# Patient Record
Sex: Male | Born: 1935 | Race: White | Hispanic: No | Marital: Married | Smoking: Never smoker
Health system: Southern US, Community
[De-identification: ages and names within clinical notes are randomized; demographics above are authoritative.]

## PROBLEM LIST (undated history)

## (undated) DIAGNOSIS — F0390 Unspecified dementia without behavioral disturbance: Secondary | ICD-10-CM

## (undated) DIAGNOSIS — K219 Gastro-esophageal reflux disease without esophagitis: Secondary | ICD-10-CM

## (undated) DIAGNOSIS — C801 Malignant (primary) neoplasm, unspecified: Secondary | ICD-10-CM

## (undated) DIAGNOSIS — I4891 Unspecified atrial fibrillation: Secondary | ICD-10-CM

## (undated) HISTORY — PX: CHOLECYSTECTOMY: SHX55

## (undated) HISTORY — PX: VASCULAR SURGERY: SHX849

## (undated) HISTORY — PX: PROSTATECTOMY: SHX69

---

## 1998-02-10 ENCOUNTER — Encounter: Payer: Self-pay | Admitting: Thoracic Surgery

## 1998-02-11 ENCOUNTER — Inpatient Hospital Stay: Admission: RE | Admit: 1998-02-11 | Discharge: 1998-02-12 | Payer: Self-pay | Admitting: Thoracic Surgery

## 2011-04-08 DIAGNOSIS — Z23 Encounter for immunization: Secondary | ICD-10-CM | POA: Diagnosis not present

## 2011-04-08 DIAGNOSIS — E782 Mixed hyperlipidemia: Secondary | ICD-10-CM | POA: Diagnosis not present

## 2011-04-08 DIAGNOSIS — Z79899 Other long term (current) drug therapy: Secondary | ICD-10-CM | POA: Diagnosis not present

## 2011-04-08 DIAGNOSIS — I4891 Unspecified atrial fibrillation: Secondary | ICD-10-CM | POA: Diagnosis not present

## 2011-04-08 DIAGNOSIS — I10 Essential (primary) hypertension: Secondary | ICD-10-CM | POA: Diagnosis not present

## 2011-07-01 DIAGNOSIS — E782 Mixed hyperlipidemia: Secondary | ICD-10-CM | POA: Diagnosis not present

## 2011-07-01 DIAGNOSIS — I4891 Unspecified atrial fibrillation: Secondary | ICD-10-CM | POA: Diagnosis not present

## 2011-07-01 DIAGNOSIS — I10 Essential (primary) hypertension: Secondary | ICD-10-CM | POA: Diagnosis not present

## 2011-07-01 DIAGNOSIS — G309 Alzheimer's disease, unspecified: Secondary | ICD-10-CM | POA: Diagnosis not present

## 2011-07-01 DIAGNOSIS — Z79899 Other long term (current) drug therapy: Secondary | ICD-10-CM | POA: Diagnosis not present

## 2011-10-06 DIAGNOSIS — L980 Pyogenic granuloma: Secondary | ICD-10-CM | POA: Diagnosis not present

## 2011-10-14 DIAGNOSIS — C44621 Squamous cell carcinoma of skin of unspecified upper limb, including shoulder: Secondary | ICD-10-CM | POA: Diagnosis not present

## 2011-10-25 DIAGNOSIS — C44621 Squamous cell carcinoma of skin of unspecified upper limb, including shoulder: Secondary | ICD-10-CM | POA: Diagnosis not present

## 2012-01-25 DIAGNOSIS — C44621 Squamous cell carcinoma of skin of unspecified upper limb, including shoulder: Secondary | ICD-10-CM | POA: Diagnosis not present

## 2012-01-25 DIAGNOSIS — L981 Factitial dermatitis: Secondary | ICD-10-CM | POA: Diagnosis not present

## 2012-02-24 DIAGNOSIS — C44621 Squamous cell carcinoma of skin of unspecified upper limb, including shoulder: Secondary | ICD-10-CM | POA: Diagnosis not present

## 2012-02-24 DIAGNOSIS — L570 Actinic keratosis: Secondary | ICD-10-CM | POA: Diagnosis not present

## 2012-04-21 DIAGNOSIS — Z79899 Other long term (current) drug therapy: Secondary | ICD-10-CM | POA: Diagnosis not present

## 2012-04-21 DIAGNOSIS — Z23 Encounter for immunization: Secondary | ICD-10-CM | POA: Diagnosis not present

## 2012-04-21 DIAGNOSIS — E782 Mixed hyperlipidemia: Secondary | ICD-10-CM | POA: Diagnosis not present

## 2012-04-21 DIAGNOSIS — Z125 Encounter for screening for malignant neoplasm of prostate: Secondary | ICD-10-CM | POA: Diagnosis not present

## 2012-04-26 DIAGNOSIS — L570 Actinic keratosis: Secondary | ICD-10-CM | POA: Diagnosis not present

## 2012-08-24 DIAGNOSIS — L570 Actinic keratosis: Secondary | ICD-10-CM | POA: Diagnosis not present

## 2012-09-26 DIAGNOSIS — L301 Dyshidrosis [pompholyx]: Secondary | ICD-10-CM | POA: Diagnosis not present

## 2012-09-26 DIAGNOSIS — L570 Actinic keratosis: Secondary | ICD-10-CM | POA: Diagnosis not present

## 2012-11-09 DIAGNOSIS — Z79899 Other long term (current) drug therapy: Secondary | ICD-10-CM | POA: Diagnosis not present

## 2012-11-09 DIAGNOSIS — I10 Essential (primary) hypertension: Secondary | ICD-10-CM | POA: Diagnosis not present

## 2012-11-09 DIAGNOSIS — Z0000 Encounter for general adult medical examination without abnormal findings: Secondary | ICD-10-CM | POA: Diagnosis not present

## 2012-11-09 DIAGNOSIS — E782 Mixed hyperlipidemia: Secondary | ICD-10-CM | POA: Diagnosis not present

## 2012-11-09 DIAGNOSIS — F0390 Unspecified dementia without behavioral disturbance: Secondary | ICD-10-CM | POA: Diagnosis not present

## 2013-05-29 DIAGNOSIS — E559 Vitamin D deficiency, unspecified: Secondary | ICD-10-CM | POA: Diagnosis not present

## 2013-05-29 DIAGNOSIS — E782 Mixed hyperlipidemia: Secondary | ICD-10-CM | POA: Diagnosis not present

## 2013-05-29 DIAGNOSIS — Z79899 Other long term (current) drug therapy: Secondary | ICD-10-CM | POA: Diagnosis not present

## 2013-05-29 DIAGNOSIS — Z125 Encounter for screening for malignant neoplasm of prostate: Secondary | ICD-10-CM | POA: Diagnosis not present

## 2013-06-08 DIAGNOSIS — C61 Malignant neoplasm of prostate: Secondary | ICD-10-CM | POA: Diagnosis not present

## 2013-06-08 DIAGNOSIS — F0390 Unspecified dementia without behavioral disturbance: Secondary | ICD-10-CM | POA: Diagnosis not present

## 2013-06-08 DIAGNOSIS — I10 Essential (primary) hypertension: Secondary | ICD-10-CM | POA: Diagnosis not present

## 2013-06-08 DIAGNOSIS — E782 Mixed hyperlipidemia: Secondary | ICD-10-CM | POA: Diagnosis not present

## 2013-11-30 DIAGNOSIS — E782 Mixed hyperlipidemia: Secondary | ICD-10-CM | POA: Diagnosis not present

## 2013-12-12 DIAGNOSIS — F02818 Dementia in other diseases classified elsewhere, unspecified severity, with other behavioral disturbance: Secondary | ICD-10-CM | POA: Diagnosis not present

## 2013-12-12 DIAGNOSIS — F0281 Dementia in other diseases classified elsewhere with behavioral disturbance: Secondary | ICD-10-CM | POA: Diagnosis not present

## 2013-12-12 DIAGNOSIS — Z760 Encounter for issue of repeat prescription: Secondary | ICD-10-CM | POA: Diagnosis not present

## 2013-12-12 DIAGNOSIS — G309 Alzheimer's disease, unspecified: Secondary | ICD-10-CM | POA: Diagnosis not present

## 2013-12-12 DIAGNOSIS — F0280 Dementia in other diseases classified elsewhere without behavioral disturbance: Secondary | ICD-10-CM | POA: Diagnosis not present

## 2013-12-12 DIAGNOSIS — Z23 Encounter for immunization: Secondary | ICD-10-CM | POA: Diagnosis not present

## 2014-03-11 DIAGNOSIS — N3090 Cystitis, unspecified without hematuria: Secondary | ICD-10-CM | POA: Diagnosis not present

## 2014-03-11 DIAGNOSIS — N3000 Acute cystitis without hematuria: Secondary | ICD-10-CM | POA: Diagnosis not present

## 2014-04-14 DIAGNOSIS — R300 Dysuria: Secondary | ICD-10-CM | POA: Diagnosis not present

## 2014-06-03 DIAGNOSIS — Z79899 Other long term (current) drug therapy: Secondary | ICD-10-CM | POA: Diagnosis not present

## 2014-06-03 DIAGNOSIS — E782 Mixed hyperlipidemia: Secondary | ICD-10-CM | POA: Diagnosis not present

## 2014-06-12 DIAGNOSIS — Z23 Encounter for immunization: Secondary | ICD-10-CM | POA: Diagnosis not present

## 2014-06-12 DIAGNOSIS — G301 Alzheimer's disease with late onset: Secondary | ICD-10-CM | POA: Diagnosis not present

## 2014-06-12 DIAGNOSIS — F0281 Dementia in other diseases classified elsewhere with behavioral disturbance: Secondary | ICD-10-CM | POA: Diagnosis not present

## 2014-06-12 DIAGNOSIS — I4891 Unspecified atrial fibrillation: Secondary | ICD-10-CM | POA: Diagnosis not present

## 2014-06-12 DIAGNOSIS — Z0000 Encounter for general adult medical examination without abnormal findings: Secondary | ICD-10-CM | POA: Diagnosis not present

## 2014-06-12 DIAGNOSIS — C61 Malignant neoplasm of prostate: Secondary | ICD-10-CM | POA: Diagnosis not present

## 2014-07-23 DIAGNOSIS — L578 Other skin changes due to chronic exposure to nonionizing radiation: Secondary | ICD-10-CM | POA: Diagnosis not present

## 2014-07-23 DIAGNOSIS — L820 Inflamed seborrheic keratosis: Secondary | ICD-10-CM | POA: Diagnosis not present

## 2014-07-23 DIAGNOSIS — L821 Other seborrheic keratosis: Secondary | ICD-10-CM | POA: Diagnosis not present

## 2014-07-23 DIAGNOSIS — L570 Actinic keratosis: Secondary | ICD-10-CM | POA: Diagnosis not present

## 2014-08-16 DIAGNOSIS — R300 Dysuria: Secondary | ICD-10-CM | POA: Diagnosis not present

## 2014-08-16 DIAGNOSIS — R5383 Other fatigue: Secondary | ICD-10-CM | POA: Diagnosis not present

## 2014-08-16 DIAGNOSIS — N3090 Cystitis, unspecified without hematuria: Secondary | ICD-10-CM | POA: Diagnosis not present

## 2014-09-23 DIAGNOSIS — L300 Nummular dermatitis: Secondary | ICD-10-CM | POA: Diagnosis not present

## 2014-09-23 DIAGNOSIS — L570 Actinic keratosis: Secondary | ICD-10-CM | POA: Diagnosis not present

## 2015-01-02 DIAGNOSIS — T45511A Poisoning by anticoagulants, accidental (unintentional), initial encounter: Secondary | ICD-10-CM | POA: Diagnosis not present

## 2015-01-02 DIAGNOSIS — Z7901 Long term (current) use of anticoagulants: Secondary | ICD-10-CM | POA: Diagnosis not present

## 2015-01-02 DIAGNOSIS — Z23 Encounter for immunization: Secondary | ICD-10-CM | POA: Diagnosis not present

## 2015-01-02 DIAGNOSIS — R8299 Other abnormal findings in urine: Secondary | ICD-10-CM | POA: Diagnosis not present

## 2015-01-10 DIAGNOSIS — Z7901 Long term (current) use of anticoagulants: Secondary | ICD-10-CM | POA: Diagnosis not present

## 2015-02-17 DIAGNOSIS — Z7901 Long term (current) use of anticoagulants: Secondary | ICD-10-CM | POA: Diagnosis not present

## 2015-03-03 DIAGNOSIS — M25559 Pain in unspecified hip: Secondary | ICD-10-CM | POA: Diagnosis not present

## 2015-03-03 DIAGNOSIS — S299XXA Unspecified injury of thorax, initial encounter: Secondary | ICD-10-CM | POA: Diagnosis not present

## 2015-03-03 DIAGNOSIS — S51011A Laceration without foreign body of right elbow, initial encounter: Secondary | ICD-10-CM | POA: Diagnosis not present

## 2015-03-03 DIAGNOSIS — F0390 Unspecified dementia without behavioral disturbance: Secondary | ICD-10-CM | POA: Diagnosis not present

## 2015-03-03 DIAGNOSIS — M25521 Pain in right elbow: Secondary | ICD-10-CM | POA: Diagnosis not present

## 2015-03-03 DIAGNOSIS — Z23 Encounter for immunization: Secondary | ICD-10-CM | POA: Diagnosis not present

## 2015-03-03 DIAGNOSIS — Z7901 Long term (current) use of anticoagulants: Secondary | ICD-10-CM | POA: Diagnosis not present

## 2015-03-03 DIAGNOSIS — S59901A Unspecified injury of right elbow, initial encounter: Secondary | ICD-10-CM | POA: Diagnosis not present

## 2015-03-03 DIAGNOSIS — T148 Other injury of unspecified body region: Secondary | ICD-10-CM | POA: Diagnosis not present

## 2015-03-03 DIAGNOSIS — S51811A Laceration without foreign body of right forearm, initial encounter: Secondary | ICD-10-CM | POA: Diagnosis not present

## 2015-03-10 DIAGNOSIS — R404 Transient alteration of awareness: Secondary | ICD-10-CM | POA: Diagnosis not present

## 2015-03-10 DIAGNOSIS — R4182 Altered mental status, unspecified: Secondary | ICD-10-CM | POA: Diagnosis not present

## 2015-03-10 DIAGNOSIS — N3000 Acute cystitis without hematuria: Secondary | ICD-10-CM | POA: Diagnosis not present

## 2015-03-10 DIAGNOSIS — R531 Weakness: Secondary | ICD-10-CM | POA: Diagnosis not present

## 2015-03-10 DIAGNOSIS — N390 Urinary tract infection, site not specified: Secondary | ICD-10-CM | POA: Diagnosis not present

## 2015-03-10 DIAGNOSIS — R410 Disorientation, unspecified: Secondary | ICD-10-CM | POA: Diagnosis not present

## 2015-03-13 DIAGNOSIS — E7800 Pure hypercholesterolemia, unspecified: Secondary | ICD-10-CM | POA: Diagnosis not present

## 2015-03-13 DIAGNOSIS — I4891 Unspecified atrial fibrillation: Secondary | ICD-10-CM | POA: Diagnosis not present

## 2015-03-13 DIAGNOSIS — I10 Essential (primary) hypertension: Secondary | ICD-10-CM | POA: Diagnosis not present

## 2015-03-13 DIAGNOSIS — N3000 Acute cystitis without hematuria: Secondary | ICD-10-CM | POA: Diagnosis not present

## 2015-03-13 DIAGNOSIS — S50311D Abrasion of right elbow, subsequent encounter: Secondary | ICD-10-CM | POA: Diagnosis not present

## 2015-03-13 DIAGNOSIS — R296 Repeated falls: Secondary | ICD-10-CM | POA: Diagnosis not present

## 2015-03-13 DIAGNOSIS — F329 Major depressive disorder, single episode, unspecified: Secondary | ICD-10-CM | POA: Diagnosis not present

## 2015-03-13 DIAGNOSIS — Z9181 History of falling: Secondary | ICD-10-CM | POA: Diagnosis not present

## 2015-03-13 DIAGNOSIS — F0391 Unspecified dementia with behavioral disturbance: Secondary | ICD-10-CM | POA: Diagnosis not present

## 2015-03-14 DIAGNOSIS — N3000 Acute cystitis without hematuria: Secondary | ICD-10-CM | POA: Diagnosis not present

## 2015-03-14 DIAGNOSIS — F329 Major depressive disorder, single episode, unspecified: Secondary | ICD-10-CM | POA: Diagnosis not present

## 2015-03-14 DIAGNOSIS — I10 Essential (primary) hypertension: Secondary | ICD-10-CM | POA: Diagnosis not present

## 2015-03-14 DIAGNOSIS — S50311D Abrasion of right elbow, subsequent encounter: Secondary | ICD-10-CM | POA: Diagnosis not present

## 2015-03-14 DIAGNOSIS — F0391 Unspecified dementia with behavioral disturbance: Secondary | ICD-10-CM | POA: Diagnosis not present

## 2015-03-14 DIAGNOSIS — R296 Repeated falls: Secondary | ICD-10-CM | POA: Diagnosis not present

## 2015-03-20 DIAGNOSIS — G301 Alzheimer's disease with late onset: Secondary | ICD-10-CM | POA: Diagnosis not present

## 2015-03-20 DIAGNOSIS — F0281 Dementia in other diseases classified elsewhere with behavioral disturbance: Secondary | ICD-10-CM | POA: Diagnosis not present

## 2015-03-20 DIAGNOSIS — S060X0A Concussion without loss of consciousness, initial encounter: Secondary | ICD-10-CM | POA: Diagnosis not present

## 2015-03-20 DIAGNOSIS — Z7901 Long term (current) use of anticoagulants: Secondary | ICD-10-CM | POA: Diagnosis not present

## 2015-03-20 DIAGNOSIS — C61 Malignant neoplasm of prostate: Secondary | ICD-10-CM | POA: Diagnosis not present

## 2015-03-20 DIAGNOSIS — I4891 Unspecified atrial fibrillation: Secondary | ICD-10-CM | POA: Diagnosis not present

## 2015-03-20 DIAGNOSIS — D510 Vitamin B12 deficiency anemia due to intrinsic factor deficiency: Secondary | ICD-10-CM | POA: Diagnosis not present

## 2015-03-27 DIAGNOSIS — N3000 Acute cystitis without hematuria: Secondary | ICD-10-CM | POA: Diagnosis not present

## 2015-06-17 DIAGNOSIS — N390 Urinary tract infection, site not specified: Secondary | ICD-10-CM | POA: Diagnosis not present

## 2015-06-18 DIAGNOSIS — N390 Urinary tract infection, site not specified: Secondary | ICD-10-CM | POA: Diagnosis not present

## 2015-07-15 DIAGNOSIS — N390 Urinary tract infection, site not specified: Secondary | ICD-10-CM | POA: Diagnosis not present

## 2015-08-28 ENCOUNTER — Emergency Department (HOSPITAL_COMMUNITY): Payer: PPO

## 2015-08-28 ENCOUNTER — Encounter (HOSPITAL_COMMUNITY): Payer: Self-pay | Admitting: Emergency Medicine

## 2015-08-28 ENCOUNTER — Observation Stay (HOSPITAL_COMMUNITY)
Admission: EM | Admit: 2015-08-28 | Discharge: 2015-08-29 | Disposition: A | Discharge status: Home / Self Care | Payer: PPO | Attending: Internal Medicine | Admitting: Internal Medicine

## 2015-08-28 DIAGNOSIS — F0390 Unspecified dementia without behavioral disturbance: Secondary | ICD-10-CM | POA: Diagnosis not present

## 2015-08-28 DIAGNOSIS — R000 Tachycardia, unspecified: Secondary | ICD-10-CM | POA: Diagnosis not present

## 2015-08-28 DIAGNOSIS — Z66 Do not resuscitate: Secondary | ICD-10-CM | POA: Insufficient documentation

## 2015-08-28 DIAGNOSIS — D751 Secondary polycythemia: Secondary | ICD-10-CM | POA: Insufficient documentation

## 2015-08-28 DIAGNOSIS — E860 Dehydration: Secondary | ICD-10-CM | POA: Insufficient documentation

## 2015-08-28 DIAGNOSIS — I4891 Unspecified atrial fibrillation: Secondary | ICD-10-CM | POA: Diagnosis not present

## 2015-08-28 DIAGNOSIS — R296 Repeated falls: Secondary | ICD-10-CM | POA: Diagnosis not present

## 2015-08-28 DIAGNOSIS — I482 Chronic atrial fibrillation: Secondary | ICD-10-CM | POA: Diagnosis not present

## 2015-08-28 DIAGNOSIS — Z79899 Other long term (current) drug therapy: Secondary | ICD-10-CM | POA: Diagnosis not present

## 2015-08-28 DIAGNOSIS — R4701 Aphasia: Secondary | ICD-10-CM | POA: Insufficient documentation

## 2015-08-28 DIAGNOSIS — R531 Weakness: Secondary | ICD-10-CM | POA: Insufficient documentation

## 2015-08-28 DIAGNOSIS — R55 Syncope and collapse: Secondary | ICD-10-CM | POA: Diagnosis not present

## 2015-08-28 DIAGNOSIS — I6789 Other cerebrovascular disease: Secondary | ICD-10-CM | POA: Diagnosis not present

## 2015-08-28 HISTORY — DX: Gastro-esophageal reflux disease without esophagitis: K21.9

## 2015-08-28 HISTORY — DX: Unspecified atrial fibrillation: I48.91

## 2015-08-28 HISTORY — DX: Unspecified dementia, unspecified severity, without behavioral disturbance, psychotic disturbance, mood disturbance, and anxiety: F03.90

## 2015-08-28 HISTORY — DX: Malignant (primary) neoplasm, unspecified: C80.1

## 2015-08-28 LAB — CBC
HCT: 53.6 % — ABNORMAL HIGH (ref 39.0–52.0)
HEMATOCRIT: 50.4 % (ref 39.0–52.0)
HEMOGLOBIN: 18 g/dL — AB (ref 13.0–17.0)
HEMOGLOBIN: 16.7 g/dL (ref 13.0–17.0)
MCH: 30.4 pg (ref 26.0–34.0)
MCH: 30.1 pg (ref 26.0–34.0)
MCHC: 33.6 g/dL (ref 30.0–36.0)
MCHC: 33.1 g/dL (ref 30.0–36.0)
MCV: 90.4 fL (ref 78.0–100.0)
MCV: 90.8 fL (ref 78.0–100.0)
PLATELETS: 191 10*3/uL (ref 150–400)
Platelets: 199 10*3/uL (ref 150–400)
RBC: 5.93 MIL/uL — AB (ref 4.22–5.81)
RBC: 5.55 MIL/uL (ref 4.22–5.81)
RDW: 13 % (ref 11.5–15.5)
RDW: 13.1 % (ref 11.5–15.5)
WBC: 11.2 10*3/uL — AB (ref 4.0–10.5)
WBC: 9.8 10*3/uL (ref 4.0–10.5)

## 2015-08-28 LAB — URINE MICROSCOPIC-ADD ON

## 2015-08-28 LAB — URINALYSIS, ROUTINE W REFLEX MICROSCOPIC
BILIRUBIN URINE: NEGATIVE
GLUCOSE, UA: NEGATIVE mg/dL
KETONES UR: NEGATIVE mg/dL
Leukocytes, UA: NEGATIVE
Nitrite: POSITIVE — AB
PH: 7 (ref 5.0–8.0)
Protein, ur: 30 mg/dL — AB
SPECIFIC GRAVITY, URINE: 1.009 (ref 1.005–1.030)

## 2015-08-28 LAB — I-STAT CHEM 8, ED
BUN: 16 mg/dL (ref 6–20)
CALCIUM ION: 1.09 mmol/L — AB (ref 1.13–1.30)
CHLORIDE: 94 mmol/L — AB (ref 101–111)
CREATININE: 1.2 mg/dL (ref 0.61–1.24)
GLUCOSE: 179 mg/dL — AB (ref 65–99)
HCT: 57 % — ABNORMAL HIGH (ref 39.0–52.0)
Hemoglobin: 19.4 g/dL — ABNORMAL HIGH (ref 13.0–17.0)
Potassium: 3.8 mmol/L (ref 3.5–5.1)
SODIUM: 140 mmol/L (ref 135–145)
TCO2: 32 mmol/L (ref 0–100)

## 2015-08-28 LAB — COMPREHENSIVE METABOLIC PANEL
ALBUMIN: 4.1 g/dL (ref 3.5–5.0)
ALK PHOS: 144 U/L — AB (ref 38–126)
ALT: 18 U/L (ref 17–63)
ANION GAP: 9 (ref 5–15)
AST: 24 U/L (ref 15–41)
BILIRUBIN TOTAL: 2.6 mg/dL — AB (ref 0.3–1.2)
BUN: 13 mg/dL (ref 6–20)
CHLORIDE: 98 mmol/L — AB (ref 101–111)
CO2: 31 mmol/L (ref 22–32)
Calcium: 9.9 mg/dL (ref 8.9–10.3)
Creatinine, Ser: 1.33 mg/dL — ABNORMAL HIGH (ref 0.61–1.24)
GFR calc non Af Amer: 46 mL/min — ABNORMAL LOW (ref >60)
GFR, EST AFRICAN AMERICAN: 53 mL/min — AB (ref >60)
GLUCOSE: 180 mg/dL — AB (ref 65–99)
POTASSIUM: 3.8 mmol/L (ref 3.5–5.1)
SODIUM: 138 mmol/L (ref 135–145)
Total Protein: 7.9 g/dL (ref 6.5–8.1)

## 2015-08-28 LAB — DIFFERENTIAL
Basophils Absolute: 0 10*3/uL (ref 0.0–0.1)
Basophils Relative: 0 %
EOS PCT: 0 %
Eosinophils Absolute: 0 10*3/uL (ref 0.0–0.7)
LYMPHS ABS: 0.9 10*3/uL (ref 0.7–4.0)
LYMPHS PCT: 8 %
MONO ABS: 0.4 10*3/uL (ref 0.1–1.0)
Monocytes Relative: 3 %
NEUTROS ABS: 10 10*3/uL — AB (ref 1.7–7.7)
Neutrophils Relative %: 89 %

## 2015-08-28 LAB — CREATININE, SERUM
CREATININE: 1.16 mg/dL (ref 0.61–1.24)
GFR calc Af Amer: >60 mL/min (ref >60)
GFR, EST NON AFRICAN AMERICAN: 54 mL/min — AB (ref >60)

## 2015-08-28 LAB — I-STAT CG4 LACTIC ACID, ED
Lactic Acid, Venous: 2.32 mmol/L (ref 0.5–2.0)
Lactic Acid, Venous: 1.58 mmol/L (ref 0.5–2.0)

## 2015-08-28 LAB — PROTIME-INR
INR: 1.16 (ref 0.00–1.49)
PROTHROMBIN TIME: 15 s (ref 11.6–15.2)

## 2015-08-28 LAB — RAPID URINE DRUG SCREEN, HOSP PERFORMED
AMPHETAMINES: NOT DETECTED
Barbiturates: NOT DETECTED
Benzodiazepines: NOT DETECTED
COCAINE: NOT DETECTED
OPIATES: NOT DETECTED
TETRAHYDROCANNABINOL: NOT DETECTED

## 2015-08-28 LAB — I-STAT TROPONIN, ED: Troponin i, poc: 0 ng/mL (ref 0.00–0.08)

## 2015-08-28 LAB — CBG MONITORING, ED: GLUCOSE-CAPILLARY: 172 mg/dL — AB (ref 65–99)

## 2015-08-28 LAB — ETHANOL: Alcohol, Ethyl (B): <5 mg/dL (ref <5)

## 2015-08-28 LAB — APTT: aPTT: 30 seconds (ref 24–37)

## 2015-08-28 MED ORDER — DILTIAZEM HCL 30 MG PO TABS
30.0000 mg | ORAL_TABLET | Freq: Four times a day (QID) | ORAL | Status: DC
  Administered 2015-08-28 – 2015-08-29 (×4): 30 mg via ORAL
  Filled 2015-08-28 (×6): qty 1

## 2015-08-28 MED ORDER — MEMANTINE HCL 10 MG PO TABS
10.0000 mg | ORAL_TABLET | Freq: Two times a day (BID) | ORAL | Status: DC
Start: 2015-08-28 — End: 2015-08-29
  Administered 2015-08-28 – 2015-08-29 (×2): 10 mg via ORAL
  Filled 2015-08-28 (×2): qty 1

## 2015-08-28 MED ORDER — SODIUM CHLORIDE 0.9 % IV BOLUS (SEPSIS)
1000.0000 mL | Freq: Once | INTRAVENOUS | Status: AC
  Administered 2015-08-28: 1000 mL via INTRAVENOUS

## 2015-08-28 MED ORDER — SODIUM CHLORIDE 0.9% FLUSH
3.0000 mL | Freq: Two times a day (BID) | INTRAVENOUS | Status: DC
  Administered 2015-08-28: 3 mL via INTRAVENOUS

## 2015-08-28 MED ORDER — SODIUM CHLORIDE 0.9 % IV SOLN
INTRAVENOUS | Status: DC
  Administered 2015-08-28: 20:00:00 via INTRAVENOUS

## 2015-08-28 MED ORDER — HEPARIN SODIUM (PORCINE) 5000 UNIT/ML IJ SOLN
5000.0000 [IU] | Freq: Three times a day (TID) | INTRAMUSCULAR | Status: DC
  Administered 2015-08-28 – 2015-08-29 (×2): 5000 [IU] via SUBCUTANEOUS
  Filled 2015-08-28 (×2): qty 1

## 2015-08-28 MED ORDER — PAROXETINE HCL 20 MG PO TABS
10.0000 mg | ORAL_TABLET | Freq: Every day | ORAL | Status: DC
  Administered 2015-08-28: 10 mg via ORAL
  Filled 2015-08-28: qty 1

## 2015-08-28 MED ORDER — ASPIRIN 81 MG PO CHEW
81.0000 mg | CHEWABLE_TABLET | Freq: Every day | ORAL | Status: DC
  Administered 2015-08-29: 81 mg via ORAL
  Filled 2015-08-28: qty 1

## 2015-08-28 MED ORDER — DONEPEZIL HCL 10 MG PO TABS
10.0000 mg | ORAL_TABLET | Freq: Every day | ORAL | Status: DC
  Administered 2015-08-28 – 2015-08-29 (×2): 10 mg via ORAL
  Filled 2015-08-28 (×2): qty 1

## 2015-08-28 NOTE — ED Notes (Signed)
Arrived by EMS lives at home woke up normal history of dementia ate breakfast and got up to wash hands and had syncope event lasting less then 2 minutes reported by EMS. EMS first reported patient unable to move left upper extremity and non verbal. During transport patient able to move upper left extremity with weakness left hand grasp compared to right and able to say some words. CBG 193.

## 2015-08-28 NOTE — ED Notes (Signed)
This RN with Marya Amsler, RN, attempted 2x to obtain a urine specimen by cath without success.

## 2015-08-28 NOTE — Code Documentation (Signed)
82yo male arriving to [facility] via [place] at [phone].  EMS reports the patient was eating breakfast at 0930 when he got up to wash his hand and "fell out."  EMS assessed patient to be nonverbal and not following commands with left sided weakness and activated a code stroke.  Patient with h/o atrial fibrillation not on anticoagulation.  EMS reports the patient started speaking about 15 minutes prior to arrival.  Stroke team at the bedside on patient arrival.  Labs drawn and patient cleared for CT by Dr. Oleta Mouse.  Patient to CT with team.  CT completed.  Dr. Cheral Marker to the bedside.  Patient transported to A5.  NIHSS 10, see documentation for details and code stroke times.  Patient unable to answer questions or follow commands on exam.  Patient with drift in LUE and bilateral lower extremities on exam.  Patient will respond verbally to tactile stimulation.  No acute stroke treatment per Dr. Cheral Marker.  Code stroke canceled per Dr. Cheral Marker.  Bedside handoff with ED RN Marya Amsler.

## 2015-08-28 NOTE — ED Notes (Signed)
Pt's son and daughter are at bedside, state that pt at baseline cannot follow one step instructions and cannot state name.  Pt's daughter reports that pt was ambulating with walker when legs got shaky.  Pt's daughter reports getting pt into chair, then states, "He slumped over and I couldn't get him to wake up."  Pt's daughter reports pt had some loud breathing but denies any shaking, trembling, twitching.  Pt's daughter reports pt opened eyes when EMS shook him roughly calling his name.  Pt's daughter states that pt is often sleepy, even falling asleep while eating. Pt's daughter and son agree that pt is at baseline at this time. Pt recognized daughter and son, asked, "Where's Mama?", seemed to follow explanation that wife was unable to come to ED.

## 2015-08-28 NOTE — H&P (Signed)
TRH H&P   Patient Demographics:    Jesus Franklin, is a 82 y.o. male  MRN: [id number]   DOB - 1924-08-08  Admit Date - 08/28/2015  Outpatient Primary MD for the patient is No primary care provider on file.  Referring MD/NP/PA: Dr. Oleta Mouse  Patient coming from: Home  Chief Complaint  Patient presents with  . Code Stroke      HPI:    Jesus Franklin  is a 82 y.o. male, With past medical history of dementia, atrial fibrillation, who presents with syncope, and lives at home by himself, daughter lives across the street, after breakfast, daughter reports patient fell out, was unresponsive for 2 minutes, there was on EMS reports left-sided weakness, but when I questioned the daughter she denied that, she denied any dysarthria, facial droop, or significant focal deficits, reports episode lasted for 2 minutes, with no residual deficits, or seizure-like activity, porcine had generalized weakness with shaking legs, she reports has history of multiple UTIs in the past, she denies any fever or chills currently, she reports he is wearing depends, NED CT head with no acute CVA, labs significant for mildly elevated lactic acid, and hemoglobin of 18, I was called to admit    Review of systems:   Unable to obtain any review of system giving patient has advanced dementia.   With Past History of the following :    Past Medical History  Diagnosis Date  . Dementia       No past surgical history on file.    Social History:     Social History  Substance Use Topics  . Smoking status: Not on file  . Smokeless tobacco: Not on file  . Alcohol Use: Not on file     Lives - At home by himself  Mobility - with assistance     Family History :   Daughter denies any coronary artery disease at young age in the family.   Home Medications:   Prior to Admission medications   Medication Sig Start Date  End Date Taking? Authorizing Provider  donepezil (ARICEPT) 10 MG tablet Take 10 mg by mouth daily.   Yes Historical Provider, MD  memantine (NAMENDA) 10 MG tablet Take 10 mg by mouth 2 (two) times daily.   Yes Historical Provider, MD  PARoxetine (PAXIL) 10 MG tablet Take 10 mg by mouth at bedtime.   Yes Historical Provider, MD     Allergies:    Allergies not on file   Physical Exam:   Vitals  Blood pressure 132/85, pulse 93, temperature 97.6 F (36.4 C), temperature source Axillary, resp. rate 13, height 5\' 10"  (1.778 m), weight 75.4 kg (166 lb 3.6 oz), SpO2 92 %.   1. General Frail elderly male lying in bed in NAD,   2. Normal affect and insight, Not Suicidal or Homicidal, Awake, confused, does not follow commands or answer questions,  but verbal.  3. No F.N deficits, ALL C.Nerves Intact, appears to be moving all extremities with no focal deficits.  4. Ears and Eyes appear Normal, Conjunctivae clear, PERRLA. Moist Oral Mucosa.  5. Supple Neck, No JVD, No cervical lymphadenopathy appriciated, No Carotid Bruits.  6. Symmetrical Chest wall movement, Good air movement bilaterally, CTAB.  7. Irregular irregular, No Gallops, Rubs or Murmurs, No Parasternal Heave.  8. Positive Bowel Sounds, Abdomen Soft, No tenderness, No organomegaly appriciated,No rebound -guarding or rigidity.  9.  No Cyanosis, Normal Skin Turgor, No Skin Rash or Bruise.  10. Good muscle tone,  joints appear normal , no effusions, Normal ROM.  11. No Palpable Lymph Nodes in Neck or Axillae     Data Review:    CBC  Recent Labs Lab 08/28/15 1049 08/28/15 1053  WBC 11.2*  --   HGB 18.0* 19.4*  HCT 53.6* 57.0*  PLT 191  --   MCV 90.4  --   MCH 30.4  --   MCHC 33.6  --   RDW 13.0  --   LYMPHSABS 0.9  --   MONOABS 0.4  --   EOSABS 0.0  --   BASOSABS 0.0  --    ------------------------------------------------------------------------------------------------------------------  Chemistries    Recent Labs Lab 08/28/15 1049 08/28/15 1053  NA 138 140  K 3.8 3.8  CL 98* 94*  CO2 31  --   GLUCOSE 180* 179*  BUN 13 16  CREATININE 1.33* 1.20  CALCIUM 9.9  --   AST 24  --   ALT 18  --   ALKPHOS 144*  --   BILITOT 2.6*  --    ------------------------------------------------------------------------------------------------------------------ estimated creatinine clearance is 43.9 mL/min (by C-G formula based on Cr of 1.2). ------------------------------------------------------------------------------------------------------------------ No results for input(s): TSH, T4TOTAL, T3FREE, THYROIDAB in the last 72 hours.  Invalid input(s): FREET3  Coagulation profile  Recent Labs Lab 08/28/15 1049  INR 1.16   ------------------------------------------------------------------------------------------------------------------- No results for input(s): DDIMER in the last 72 hours. -------------------------------------------------------------------------------------------------------------------  Cardiac Enzymes No results for input(s): CKMB, TROPONINI, MYOGLOBIN in the last 168 hours.  Invalid input(s): CK ------------------------------------------------------------------------------------------------------------------ No results found for: BNP   ---------------------------------------------------------------------------------------------------------------  Urinalysis No results found for: COLORURINE, APPEARANCEUR, LABSPEC, PHURINE, GLUCOSEU, HGBUR, BILIRUBINUR, KETONESUR, PROTEINUR, UROBILINOGEN, NITRITE, LEUKOCYTESUR  ----------------------------------------------------------------------------------------------------------------   Imaging Results:    Ct Head Wo Contrast  08/28/2015  CLINICAL DATA:  Left-sided weakness, aphasia. EXAM: CT HEAD WITHOUT CONTRAST TECHNIQUE: Contiguous axial images were obtained from the base of the skull through the vertex without intravenous  contrast. COMPARISON:  CT scan of head of March 10, 2015. FINDINGS: Bony calvarium appears intact. Moderate diffuse cortical atrophy is noted. Mild chronic ischemic white matter disease is noted. No mass effect or midline shift is noted. Ventricular size is within normal limits. There is no evidence of mass lesion, hemorrhage or acute infarction. IMPRESSION: Moderate diffuse cortical atrophy. Mild chronic ischemic white matter disease. No acute intracranial abnormality seen. These results were called by telephone at the time of interpretation on 08/28/2015 at 11:09 am to Dr. Cheral Marker, who verbally acknowledged these results. Electronically Signed   By: Marijo Conception, M.D.   On: 08/28/2015 11:10   Dg Chest Portable 1 View  08/28/2015  CLINICAL DATA:  Dementia, fall, loss of consciousness. EXAM: PORTABLE CHEST 1 VIEW COMPARISON:  Chest x-rays dated 03/10/2015 and 03/03/2015. FINDINGS: Study is hypoinspiratory with crowding of the perihilar and bibasilar bronchovascular markings. Given the low lung volumes, lungs are clear. No pleural effusion  or pneumothorax seen. Cardiomediastinal silhouette appears stable. No acute osseous abnormality seen. Elevation of the right humeral head likely represents chronic overlying rotator cuff tear. IMPRESSION: Low lung volumes.  No evidence of acute cardiopulmonary abnormality. Electronically Signed   By: Franki Cabot M.D.   On: 08/28/2015 11:37    My personal review of EKG: Rhythm A Fib, Rate 105  /min, QTc 463  , no Acute ST changes   Assessment & Plan:    Active Problems:   Syncope   Dementia   Atrial fibrillation (HCC)   Multiple falls  Syncope - Presents with a brief episode of loss of consciousness, no focal deficits, likely related to CVA, MRI brain pending, neurology consult appreciated. - We'll check orthostatics is, we'll continue with gentle hydration meanwhile, will check urinalysis given history of multiple UTIs in the past, will hold on starting  antibiotics pending further results. - Monitor on telemetry, check 2-D echo  Dementia - Continue with home medication  Atrial fibrillation - Patient has been on Peridex and warfarin in the past by PCP, has been stopped secondary to multiple falls, daughter reports at least he had 6 falls over last year. - We'll start on aspirin 81 mg oral daily, heart rate with episodes of RVR, will start on low-dose Cardizem  Multiple falls - We'll consult PT  Polycythemia - Continue elevated hemoglobin at 18, this is most likely related to dehydration, continue with IV fluid, recheck CBC in a.m.  DVT Prophylaxis Heparin   AM Labs Ordered, also please review Full Orders  Family Communication: Admission, patients condition and plan of care including tests being ordered have been discussed with the daughter and son who indicate understanding and agree with the plan and Code Status.  Code Status DO NOT RESUSCITATE, confirmed by daughter and son at bedside  Likely DC to  Home  Condition GUARDED    Consults called: Neurology  Admission status: Observation  Time spent in minutes : 55 minutes   Marly Schuld M.D on 08/28/2015 at 1:15 PM  Between 7am to 7pm - Pager - [phone]. After 7pm go to [email or web address] - password [facility]  [facility] - Office  [phone]

## 2015-08-28 NOTE — ED Provider Notes (Signed)
CSN: [id number]     Arrival date & time 08/28/15  1041 History   First MD Initiated Contact with Patient 08/28/15 1043     Chief Complaint  Patient presents with  . Code Stroke     (Consider location/radiation/quality/duration/timing/severity/associated sxs/prior Treatment) HPI  Level 5 caveat due to AMS.  History provided by EMS. Patient has a history of dementia, but at baseline is conversational. Was getting up from the breakfast table today and walking when he "fell out". Had loss of consciousness for 2 minutes, and upon waking up was noted to be not moving his left side and not talkative. A code stroke activated by EMS. Initially noted to be hypertensive. Point-of-care glucose was 193.  Past Medical History  Diagnosis Date  . Dementia    No past surgical history on file. No family history on file. Social History  Substance Use Topics  . Smoking status: None  . Smokeless tobacco: None  . Alcohol Use: None    Review of Systems  Unable to perform ROS: Mental status change      Allergies  Review of patient's allergies indicates not on file.  Home Medications   Prior to Admission medications   Medication Sig Start Date End Date Taking? Authorizing Provider  donepezil (ARICEPT) 10 MG tablet Take 10 mg by mouth daily.   Yes Historical Provider, MD  memantine (NAMENDA) 10 MG tablet Take 10 mg by mouth 2 (two) times daily.   Yes Historical Provider, MD  PARoxetine (PAXIL) 10 MG tablet Take 10 mg by mouth at bedtime.   Yes Historical Provider, MD   BP 132/85 mmHg  Pulse 93  Temp(Src) 97.6 F (36.4 C) (Axillary)  Resp 13  Ht 5\' 10"  (1.778 m)  Wt 166 lb 3.6 oz (75.4 kg)  BMI 23.85 kg/m2  SpO2 92% Physical Exam Physical Exam  Nursing note and vitals reviewed. Constitutional: Elderly, cachectic, non-toxic, and in no acute distress Head: Normocephalic and atraumatic.  Mouth/Throat: Oropharynx is clear and dry mucous membrane.  Neck: Normal range of motion. Neck  supple.  Cardiovascular: Tachycardic rate and regular rhythm.  No edema Pulmonary/Chest: Effort normal and breath sounds normal.  Abdominal: Soft. There is no tenderness. There is no rebound and no guarding.  Musculoskeletal: No deformities Neurological: Opens eyes to repeated command, will say yes once or twice but does not answer any further questions, moves bilateral upper and lower extremities with tactile stimuli but will not obey commands, Pupils 2 mm symmetric and reactive to light Skin: Skin is warm and dry.  Psychiatric: Cooperative   ED Course  Procedures (including critical care time) Labs Review Labs Reviewed  CBC - Abnormal; Notable for the following:    WBC 11.2 (*)    RBC 5.93 (*)    Hemoglobin 18.0 (*)    HCT 53.6 (*)    All other components within normal limits  DIFFERENTIAL - Abnormal; Notable for the following:    Neutro Abs 10.0 (*)    All other components within normal limits  COMPREHENSIVE METABOLIC PANEL - Abnormal; Notable for the following:    Chloride 98 (*)    Glucose, Bld 180 (*)    Creatinine, Ser 1.33 (*)    Alkaline Phosphatase 144 (*)    Total Bilirubin 2.6 (*)    GFR calc non Af Amer 46 (*)    GFR calc Af Amer 53 (*)    All other components within normal limits  I-STAT CHEM 8, ED - Abnormal; Notable for the  following:    Chloride 94 (*)    Glucose, Bld 179 (*)    Calcium, Ion 1.09 (*)    Hemoglobin 19.4 (*)    HCT 57.0 (*)    All other components within normal limits  I-STAT CG4 LACTIC ACID, ED - Abnormal; Notable for the following:    Lactic Acid, Venous 2.32 (*)    All other components within normal limits  CBG MONITORING, ED - Abnormal; Notable for the following:    Glucose-Capillary 172 (*)    All other components within normal limits  ETHANOL  PROTIME-INR  APTT  URINE RAPID DRUG SCREEN, HOSP PERFORMED  URINALYSIS, ROUTINE W REFLEX MICROSCOPIC (NOT AT [facility])  I-STAT TROPOININ, ED    Imaging Review Ct Head Wo  Contrast  08/28/2015  CLINICAL DATA:  Left-sided weakness, aphasia. EXAM: CT HEAD WITHOUT CONTRAST TECHNIQUE: Contiguous axial images were obtained from the base of the skull through the vertex without intravenous contrast. COMPARISON:  CT scan of head of March 10, 2015. FINDINGS: Bony calvarium appears intact. Moderate diffuse cortical atrophy is noted. Mild chronic ischemic white matter disease is noted. No mass effect or midline shift is noted. Ventricular size is within normal limits. There is no evidence of mass lesion, hemorrhage or acute infarction. IMPRESSION: Moderate diffuse cortical atrophy. Mild chronic ischemic white matter disease. No acute intracranial abnormality seen. These results were called by telephone at the time of interpretation on 08/28/2015 at 11:09 am to Dr. Cheral Marker, who verbally acknowledged these results. Electronically Signed   By: Marijo Conception, M.D.   On: 08/28/2015 11:10   Dg Chest Portable 1 View  08/28/2015  CLINICAL DATA:  Dementia, fall, loss of consciousness. EXAM: PORTABLE CHEST 1 VIEW COMPARISON:  Chest x-rays dated 03/10/2015 and 03/03/2015. FINDINGS: Study is hypoinspiratory with crowding of the perihilar and bibasilar bronchovascular markings. Given the low lung volumes, lungs are clear. No pleural effusion or pneumothorax seen. Cardiomediastinal silhouette appears stable. No acute osseous abnormality seen. Elevation of the right humeral head likely represents chronic overlying rotator cuff tear. IMPRESSION: Low lung volumes.  No evidence of acute cardiopulmonary abnormality. Electronically Signed   By: Franki Cabot M.D.   On: 08/28/2015 11:37   I have personally reviewed and evaluated these images and lab results as part of my medical decision-making.   EKG Interpretation   Date/Time:  Thursday August 28 2015 10:59:59 EDT Ventricular Rate:  105 PR Interval:    QRS Duration: 108 QT Interval:  350 QTC Calculation: 463 R Axis:   -35 Text Interpretation:   Atrial fibrillation Abnormal R-wave progression,  early transition LVH with secondary repolarization abnormality Baseline  wander in lead(s) II III aVF V2 No prior atrial fibrillation Confirmed by  Quamir Willemsen MD, Hinton Dyer [id number]) on 08/28/2015 12:14:21 PM      MDM   Final diagnoses:  Atrial fibrillation with RVR (HCC)  Syncope and collapse    82 year old male who presents with syncope and strokelike symptoms. On presentation seems to be at his baseline according to family. Now moving extremities spontaneously, and only saying one-word answers to questions. He has a CT head that is negative for intracranial bleeding. He does have new onset atrial fibrillation with rate in the 90s to 110s. Neurology did evaluate and did not feel symptoms consistent with that of stroke, but recommending MRI and if negative no further workup. Altered mental status and syncope workup also performed. No major metabolic or electrolyte derangements, but does have potential mild AK I with a  creatinine of 1.33 and mildly elevated lactate of 2.5. Looks dry on exam and likely from dehydration. Blood work also looks hemoconcentrated. Given 1 L of IV fluids. Pending urinalysis to evaluate for UTI. Chest x-ray is clear. Discussed with [facility] will admit for new onset atrial fibrillation and syncopal workup.    Forde Dandy, MD 08/28/15 1308

## 2015-08-28 NOTE — Care Management Note (Signed)
Case Management Note  Patient Details  Name: Jesus Franklin MRN: [id number] Date of Birth: 06/12/1924  Subjective/Objective:                  Patient has a history of dementia, but at baseline is conversational. Was getting up from the breakfast table today and walking when he "fell out". Had loss of consciousness for 2 minutes, and upon waking up was noted to be not moving his left side and not talkative./ From home with spouse.  Action/Plan: Follow for disposition needs. /Anticipate home health vs SNF   Expected Discharge Date:  08/30/15               Expected Discharge Plan:  [place]  In-House Referral:     Discharge planning Services  CM Consult  Post Acute Care Choice:    Choice offered to:     DME Arranged:    DME Agency:     HH Arranged:    HH Agency:     Status of Service:  In process, will continue to follow  Medicare Important Message Given:    Date Medicare IM Given:    Medicare IM give by:    Date Additional Medicare IM Given:    Additional Medicare Important Message give by:     If discussed at [place] of Stay Meetings, dates discussed:    Additional Comments:  Fuller Mandril, RN 08/28/2015, 2:25 PM

## 2015-08-28 NOTE — Progress Notes (Signed)
Patient arrived in the unit accompanied by two NT via stretcher. Patient alert but confused and does not follow commands.Jesus KitchentFamily members at bedside. Family members verbalizes understanding.

## 2015-08-28 NOTE — ED Notes (Signed)
Pt CBG, 172. Nurse was notified.

## 2015-08-28 NOTE — Consult Note (Signed)
Requesting Physician: Dr. Oleta Mouse    Chief Complaint: Code stroke  HPI:                                                                                                                                         Jesus Franklin is an 82 y.o. male arriving to [facility] via [facility] at [phone]. EMS reports the patient was eating breakfast at 0930 when he got up to wash his hand and "fell out." EMS assessed patient to be nonverbal and not following commands with left sided weakness and activated a code stroke. Patient with h/o atrial fibrillation not on anticoagulation. EMS reports the patient started speaking about 15 minutes prior to arrival. Stroke team at the bedside on patient arrival. Labs drawn and patient cleared for CT by Dr. Oleta Mouse. CT completed and showed no acute stroke. Initial NIHSS 10, with significant improvement at time of exam by Neurology consultation team. Patient unable to answer questions or follow commands on exam. Patient with drift in LUE and bilateral lower extremities on exam. Patient will respond verbally to tactile stimulation.  Date last known well: Today Time last known well: Time: 09:00 tPA Given: No: minimal symptoms   Past Medical History  Diagnosis Date  . Dementia     No past surgical history on file.  No family history on file. Social History:  has no tobacco, alcohol, and drug history on file.  Allergies: Allergies not on file  Medications:                                                                                                                           Current Facility-Administered Medications  Medication Dose Route Frequency Provider Last Rate Last Dose  . sodium chloride 0.9 % bolus 1,000 mL  1,000 mL Intravenous Once Forde Dandy, MD      . sodium chloride 0.9 % bolus 1,000 mL  1,000 mL Intravenous Once Forde Dandy, MD       Current Outpatient Prescriptions  Medication Sig Dispense Refill  . donepezil (ARICEPT) 10 MG tablet Take 10 mg by  mouth daily.    . memantine (NAMENDA) 10 MG tablet Take 10 mg by mouth 2 (two) times daily.    Marland Kitchen PARoxetine (PAXIL) 10 MG tablet Take 10 mg by mouth at bedtime.       ROS:  History obtained from unobtainable from patient due to mental status   Neurologic Examination:                                                                                                      Blood pressure 176/114, pulse 114, temperature 97.6 F (36.4 C), temperature source Axillary, resp. rate 20, height 5\' 10"  (1.778 m), weight 75.4 kg (166 lb 3.6 oz), SpO2 94 %.  HEENT-  Normocephalic, no lesions, without obvious abnormality.  Normal external eye and conjunctiva.  Normal TM's bilaterally.  Normal auditory canals and external ears. Normal external nose, mucus membranes and septum.  Normal pharynx. Cardiovascular- irregularly irregular rhythm, pulses palpable throughout   Lungs- chest clear, no wheezing, rales, normal symmetric air entry Abdomen- normal findings: bowel sounds normal Extremities- no edema Lymph-no adenopathy palpable Musculoskeletal-no joint tenderness, deformity or swelling Skin-warm and dry, no hyperpigmentation, vitiligo, or suspicious lesions  Neurological Examination Mental Status: Alert, follows no commands, withdraws to noxious stimuli. Will verbalize "hey, ouch". Cranial Nerves: II: blinks bilaterally to threat, pupils equal, round, reactive to light and accommodation III,IV, VI: ptosis not present, extra-ocular motions intact bilaterally V,VII: smile symmetric, facial light touch sensation normal bilaterally VIII: hearing decreased bilaterally IX,X: uvula rises symmetrically XI: bilateral shoulder shrug XII: midline tongue extension Motor: Right : Upper extremity   5/5    Left:     Upper extremity   5/5  Lower extremity   5/5     Lower extremity    5/5 Sensory: Pinprick and light touch intact throughout, bilaterally Deep Tendon Reflexes: 2+ and symmetric throughout Plantars: Right: downgoing   Left: downgoing Cerebellar: No dysmetria noted in UE Gait: not tested  Lab Results: Basic Metabolic Panel:  Recent Labs Lab 08/28/15 1049 08/28/15 1053  NA 138 140  K 3.8 3.8  CL 98* 94*  CO2 31  --   GLUCOSE 180* 179*  BUN 13 16  CREATININE 1.33* 1.20  CALCIUM 9.9  --     Liver Function Tests:  Recent Labs Lab 08/28/15 1049  AST 24  ALT 18  ALKPHOS 144*  BILITOT 2.6*  PROT 7.9  ALBUMIN 4.1   No results for input(s): LIPASE, AMYLASE in the last 168 hours. No results for input(s): AMMONIA in the last 168 hours.  CBC:  Recent Labs Lab 08/28/15 1049 08/28/15 1053  WBC 11.2*  --   NEUTROABS 10.0*  --   HGB 18.0* 19.4*  HCT 53.6* 57.0*  MCV 90.4  --   PLT 191  --     Cardiac Enzymes: No results for input(s): CKTOTAL, CKMB, CKMBINDEX, TROPONINI in the last 168 hours.  Lipid Panel: No results for input(s): CHOL, TRIG, HDL, CHOLHDL, VLDL, LDLCALC in the last 168 hours.  CBG:  Recent Labs Lab 08/28/15 [place]    Microbiology: No results found for this or any previous visit.  Coagulation Studies:  Recent Labs  08/28/15 1049  LABPROT 15.0  INR 1.16    Imaging: Ct Head Wo Contrast  08/28/2015  CLINICAL DATA:  Left-sided weakness, aphasia. EXAM:  CT HEAD WITHOUT CONTRAST TECHNIQUE: Contiguous axial images were obtained from the base of the skull through the vertex without intravenous contrast. COMPARISON:  CT scan of head of March 10, 2015. FINDINGS: Bony calvarium appears intact. Moderate diffuse cortical atrophy is noted. Mild chronic ischemic white matter disease is noted. No mass effect or midline shift is noted. Ventricular size is within normal limits. There is no evidence of mass lesion, hemorrhage or acute infarction. IMPRESSION: Moderate diffuse cortical atrophy. Mild chronic  ischemic white matter disease. No acute intracranial abnormality seen. These results were called by telephone at the time of interpretation on 08/28/2015 at 11:09 am to Dr. Cheral Marker, who verbally acknowledged these results. Electronically Signed   By: Marijo Conception, M.D.   On: 08/28/2015 11:10    History and examination documented by Etta Quill PA-C, [facility], [phone]  Assessment:  62. 82 y.o. male with transient LOC followed by decreased responsiveness and mutism, more characteristic of syncope and delayed recovery in the context of underlying dementia, rather than acute stroke. Patient has significant dementia and can not give any history. Exam has no localizing or lateralizing findings other than dysphasia, exhibiting a pattern more consistent with dementia than a receptive or expressive aphasia secondary to stroke. Seizure is possible, but relatively unlikely.  2. No acute abnormality seen on CT head.   3. Stroke Risk Factors - atrial fibrillation. 4. Elevated hematocrit. DDx includes polycythemia vera.   Recommend: 1. MRI brain. If negative, no further stroke work up.  2. EEG.  3. Likely a falls risk and therefore not a good candidate for oral anticoagulation. Consider starting ASA for primary stroke prevention in the setting of atrial fibrillation.   Electronically signed: Dr. Kerney Elbe 08/28/2015, 11:30 AM

## 2015-08-28 NOTE — ED Notes (Signed)
Attempted to call report

## 2015-08-29 ENCOUNTER — Observation Stay (HOSPITAL_COMMUNITY): Payer: PPO

## 2015-08-29 DIAGNOSIS — I482 Chronic atrial fibrillation: Secondary | ICD-10-CM | POA: Diagnosis not present

## 2015-08-29 DIAGNOSIS — F0390 Unspecified dementia without behavioral disturbance: Secondary | ICD-10-CM

## 2015-08-29 DIAGNOSIS — R296 Repeated falls: Secondary | ICD-10-CM | POA: Diagnosis not present

## 2015-08-29 DIAGNOSIS — R55 Syncope and collapse: Secondary | ICD-10-CM | POA: Diagnosis not present

## 2015-08-29 LAB — BASIC METABOLIC PANEL
Anion gap: 10 (ref 5–15)
BUN: 11 mg/dL (ref 6–20)
CALCIUM: 9.2 mg/dL (ref 8.9–10.3)
CHLORIDE: 102 mmol/L (ref 101–111)
CO2: 26 mmol/L (ref 22–32)
CREATININE: 1.1 mg/dL (ref 0.61–1.24)
GFR calc Af Amer: >60 mL/min (ref >60)
GFR calc non Af Amer: 58 mL/min — ABNORMAL LOW (ref >60)
GLUCOSE: 99 mg/dL (ref 65–99)
Potassium: 3.4 mmol/L — ABNORMAL LOW (ref 3.5–5.1)
Sodium: 138 mmol/L (ref 135–145)

## 2015-08-29 LAB — CBC
HCT: 46 % (ref 39.0–52.0)
Hemoglobin: 14.9 g/dL (ref 13.0–17.0)
MCH: 29.6 pg (ref 26.0–34.0)
MCHC: 32.4 g/dL (ref 30.0–36.0)
MCV: 91.5 fL (ref 78.0–100.0)
PLATELETS: 180 10*3/uL (ref 150–400)
RBC: 5.03 MIL/uL (ref 4.22–5.81)
RDW: 13.2 % (ref 11.5–15.5)
WBC: 9.6 10*3/uL (ref 4.0–10.5)

## 2015-08-29 MED ORDER — ASPIRIN 81 MG PO CHEW: 81.0000 mg | CHEWABLE_TABLET | Freq: Every day | ORAL | Status: AC

## 2015-08-29 MED ORDER — DILTIAZEM HCL 30 MG PO TABS: 30.0000 mg | ORAL_TABLET | Freq: Four times a day (QID) | ORAL | Status: AC

## 2015-08-29 MED ORDER — OFF THE BEAT BOOK
Freq: Once | Status: AC
  Administered 2015-08-29: 10:00:00
  Filled 2015-08-29: qty 1

## 2015-08-29 MED ORDER — POTASSIUM CHLORIDE CRYS ER 20 MEQ PO TBCR
40.0000 meq | EXTENDED_RELEASE_TABLET | Freq: Once | ORAL | Status: AC
  Administered 2015-08-29: 40 meq via ORAL
  Filled 2015-08-29: qty 2

## 2015-08-29 NOTE — Discharge Summary (Signed)
Physician Discharge Summary  Jesus Franklin [id number] DOB: Mar 31, 1924 DOA: 08/28/2015  PCP: Ann Held, MD  Admit date: 08/28/2015 Discharge date: 08/29/2015  Recommendations for Outpatient Follow-up:  1.  PCP in 1 week for reeval of a-fib and tolerance of diltiazem and aspirin.  Repeat BMP 2.  Family already arranging for equipment and home health services through their PCP  Discharge Diagnoses:  Active Problems:   Syncope   Dementia   Atrial fibrillation (HCC)   Multiple falls   Discharge Condition: stable, improved  Diet recommendation:  regular  Wt Readings from Last 3 Encounters:  08/29/15 75.252 kg (165 lb 14.4 oz)    History of present illness:   Mathias Cicero is a 82 y.o. Male with history of dementia, atrial fibrillation, recurrent UTI who presents with syncope.  He lives at home by himself, daughter lives across the street.  After breakfast, daughter reported patient became unresponsive for 2 minutes.  EMS reported left-sided weakness, but daughter denied any dysarthria, facial droop, or significant focal deficits.  Episode lasted for 2 minutes, with no residual deficits, or seizure-like activity.  He had some generalized weakness with shaking legs.  She denied any fever or chills.  He has not been eating or drinking as much recently.  CT head with no acute CVA, labs significant for mildly elevated lactic acid, and hemoglobin of 18.    Hospital Course:   Syncope, likely orthostatic due to dehydration secondary to dementia.  His UA was positive for nitrites, however, he did not have many bacteria or WBC to suggest active infection.  Urine culture is pending but he was not started on antibiotics.  His hemoconcentrated CBC and elevated BUN:Cr suggested dehydration.  He was given IVF and he had dramatic improvement in his mentation.  His daughter and granddaughter stayed with him and felt that he is back to his baseline mentation this morning.  They asked whether the ECHO  and MRI that were ordered by the admitting physician would change management and stated that he would not want to have full anticoagulation or anything more than a baby aspirin, nor would he want any procedures.  He has a living will that is very clear about what his wishes were for his end-of-life care.  They requested that they be able to take him home and continue to care for him.  His daughter and granddaughter and wife all share caretaking responsibilities for him.  His telemetry demonstrated a-fib with rate that was trending down with IVF.  He is at risk for recurrent dehydration due to his dementia.  If this becomes a frequent problem, PCP can refer for hospice.  Family trusts PCP and PCP is already arranging for home hospital bed.  They will request home health RN and PT if they feel he needs it when they get home.    Dementia Continued with home medication  Atrial fibrillation, chronic.  CHADs2vasc = at least 2 for age.  Patient has been on full anticoagulation in the past, but this was stopped secondary to multiple falls in the last year.  The admitting physician discussed aspirin and medication for rate control due to tachycardia to the 120s and the family was amenable to this plan.  I discussed the added pill burden, particularly since the diltiazem is four times a day and we are potentially only treating numbers and not symptoms.  He was unable to transition to extended release dilt because he is unable to swallow capsules.  Family elected to  try a Kabella Cassidy period of the medication and then discuss again with his PCP.   Multiple falls.  Family aware of falls risk.  Feel he is back to his baseline and want to return home.  Will consult with PCP regarding home health PT if they feel it is needed.    Elevated hemoglobin at 18, due to dehydration, and resolved with IVF.  Procedures:  none  Consultations:  none  Discharge Exam: Filed Vitals:   08/29/15 0613 08/29/15 0805  BP: 158/89 149/98   Pulse: 77   Temp: 98.7 F (37.1 C)   Resp: 18    Filed Vitals:   08/28/15 2027 08/29/15 0050 08/29/15 0613 08/29/15 0805  BP: 156/98 130/57 158/89 149/98  Pulse: 86 82 77   Temp: 98.6 F (37 C) 98 F (36.7 C) 98.7 F (37.1 C)   TempSrc: Oral Oral Axillary   Resp: 18 18 18    Height:      Weight:   75.252 kg (165 lb 14.4 oz)   SpO2: 96% 96% 97%     General: thin adult male, sleeping but arouseable, confused and minimally communicative Cardiovascular: RRR, no mrg Respiratory: CTAB anteriorly ABD:  NABS, soft, NT/ND MSK:  No LEE, normal tone and decreased bulk Neuro:  CN II-XII grossly intact, moves all extremities spontaneously  Discharge Instructions      Discharge Instructions    Call MD for:  difficulty breathing, headache or visual disturbances    Complete by:  As directed      Call MD for:  extreme fatigue    Complete by:  As directed      Call MD for:  hives    Complete by:  As directed      Call MD for:  persistant dizziness or light-headedness    Complete by:  As directed      Call MD for:  persistant nausea and vomiting    Complete by:  As directed      Call MD for:  severe uncontrolled pain    Complete by:  As directed      Call MD for:  temperature >100.4    Complete by:  As directed      Diet general    Complete by:  As directed      Increase activity slowly    Complete by:  As directed             Medication List    TAKE these medications        aspirin 81 MG chewable tablet  Chew 1 tablet (81 mg total) by mouth daily.     diltiazem 30 MG tablet  Commonly known as:  CARDIZEM  Take 1 tablet (30 mg total) by mouth every 6 (six) hours.     donepezil 10 MG tablet  Commonly known as:  ARICEPT  Take 10 mg by mouth daily.     memantine 10 MG tablet  Commonly known as:  NAMENDA  Take 10 mg by mouth 2 (two) times daily.     PARoxetine 10 MG tablet  Commonly known as:  PAXIL  Take 10 mg by mouth at bedtime.       Follow-up Information     Follow up with Ann Held, MD On 09/05/2015.   Specialty:  Family Medicine   Why:  At 11:00am ... for [facility] information:   [place] [id number] [phone]        The results  of significant diagnostics from this hospitalization (including imaging, microbiology, ancillary and laboratory) are listed below for reference.    Significant Diagnostic Studies: Ct Head Wo Contrast  08/28/2015  CLINICAL DATA:  Left-sided weakness, aphasia. EXAM: CT HEAD WITHOUT CONTRAST TECHNIQUE: Contiguous axial images were obtained from the base of the skull through the vertex without intravenous contrast. COMPARISON:  CT scan of head of March 10, 2015. FINDINGS: Bony calvarium appears intact. Moderate diffuse cortical atrophy is noted. Mild chronic ischemic white matter disease is noted. No mass effect or midline shift is noted. Ventricular size is within normal limits. There is no evidence of mass lesion, hemorrhage or acute infarction. IMPRESSION: Moderate diffuse cortical atrophy. Mild chronic ischemic white matter disease. No acute intracranial abnormality seen. These results were called by telephone at the time of interpretation on 08/28/2015 at 11:09 am to Dr. Cheral Marker, who verbally acknowledged these results. Electronically Signed   By: Marijo Conception, M.D.   On: 08/28/2015 11:10   Dg Chest Portable 1 View  08/28/2015  CLINICAL DATA:  Dementia, fall, loss of consciousness. EXAM: PORTABLE CHEST 1 VIEW COMPARISON:  Chest x-rays dated 03/10/2015 and 03/03/2015. FINDINGS: Study is hypoinspiratory with crowding of the perihilar and bibasilar bronchovascular markings. Given the low lung volumes, lungs are clear. No pleural effusion or pneumothorax seen. Cardiomediastinal silhouette appears stable. No acute osseous abnormality seen. Elevation of the right humeral head likely represents chronic overlying rotator cuff tear. IMPRESSION: Low lung volumes.  No evidence of acute  cardiopulmonary abnormality. Electronically Signed   By: Franki Cabot M.D.   On: 08/28/2015 11:37    Microbiology: No results found for this or any previous visit (from the past 240 hour(s)).   Labs: Basic Metabolic Panel:  Recent Labs Lab 08/28/15 1049 08/28/15 1053 08/28/15 1952 08/29/15 0417  NA 138 140  --  138  K 3.8 3.8  --  3.4*  CL 98* 94*  --  102  CO2 31  --   --  26  GLUCOSE 180* 179*  --  99  BUN 13 16  --  11  CREATININE 1.33* 1.20 1.16 1.10  CALCIUM 9.9  --   --  9.2   Liver Function Tests:  Recent Labs Lab 08/28/15 1049  AST 24  ALT 18  ALKPHOS 144*  BILITOT 2.6*  PROT 7.9  ALBUMIN 4.1   No results for input(s): LIPASE, AMYLASE in the last 168 hours. No results for input(s): AMMONIA in the last 168 hours. CBC:  Recent Labs Lab 08/28/15 1049 08/28/15 1053 08/28/15 1952 08/29/15 0417  WBC 11.2*  --  9.8 9.6  NEUTROABS 10.0*  --   --   --   HGB 18.0* 19.4* 16.7 14.9  HCT 53.6* 57.0* 50.4 46.0  MCV 90.4  --  90.8 91.5  PLT 191  --  199 180   Cardiac Enzymes: No results for input(s): CKTOTAL, CKMB, CKMBINDEX, TROPONINI in the last 168 hours. BNP: BNP (last 3 results) No results for input(s): BNP in the last 8760 hours.  ProBNP (last 3 results) No results for input(s): PROBNP in the last 8760 hours.  CBG:  Recent Labs Lab 08/28/15 1109  GLUCAP 172*    Time coordinating discharge: 35 minutes  Signed:  Keandra Medero  [facility] 08/29/2015, 12:12 PM

## 2015-08-29 NOTE — Progress Notes (Signed)
PT Cancellation Note  Patient Details Name: Jesus Franklin MRN: [id number] DOB: 06-Jan-1925   Cancelled Treatment:    Reason Eval/Treat Not Completed: Other (comment). Per MD pt's family is going to take pt home and don't feel they need any services at this time. If they feel they need services they plan to go through their primary care physician. Will sign off.   Rory Montel 08/29/2015, 11:11 AM Suanne Marker PT [phone]

## 2015-08-29 NOTE — Care Management Obs Status (Signed)
[place] NOTIFICATION   Patient Details  Name: Jesus Franklin MRN: [id number] Date of Birth: 1924-05-02   Medicare Observation Status Notification Given:  Yes    MayoKym Groom, RN 08/29/2015, 10:15 AM

## 2015-08-29 NOTE — Progress Notes (Signed)
1352 Discharge instructions and presciprtions given to pt"s daughter . Verbalized undrestanding . Pt unable to understand R/T hx og dementia

## 2015-08-31 ENCOUNTER — Telehealth: Payer: Self-pay | Admitting: Internal Medicine

## 2015-08-31 NOTE — Telephone Encounter (Signed)
Patient acting agitated per family.  Will call in a prescription for antibiotics once sensitivities have been reported.

## 2015-09-01 LAB — URINE CULTURE: Culture: >=100000 — AB

## 2015-09-09 DIAGNOSIS — R269 Unspecified abnormalities of gait and mobility: Secondary | ICD-10-CM | POA: Diagnosis not present

## 2015-09-09 DIAGNOSIS — M1990 Unspecified osteoarthritis, unspecified site: Secondary | ICD-10-CM | POA: Diagnosis not present

## 2015-10-09 DIAGNOSIS — M1990 Unspecified osteoarthritis, unspecified site: Secondary | ICD-10-CM | POA: Diagnosis not present

## 2015-10-09 DIAGNOSIS — R269 Unspecified abnormalities of gait and mobility: Secondary | ICD-10-CM | POA: Diagnosis not present

## 2015-11-09 DIAGNOSIS — M1990 Unspecified osteoarthritis, unspecified site: Secondary | ICD-10-CM | POA: Diagnosis not present

## 2015-11-09 DIAGNOSIS — R269 Unspecified abnormalities of gait and mobility: Secondary | ICD-10-CM | POA: Diagnosis not present

## 2015-12-10 DIAGNOSIS — R269 Unspecified abnormalities of gait and mobility: Secondary | ICD-10-CM | POA: Diagnosis not present

## 2015-12-10 DIAGNOSIS — M1990 Unspecified osteoarthritis, unspecified site: Secondary | ICD-10-CM | POA: Diagnosis not present

## 2016-01-09 DIAGNOSIS — M1990 Unspecified osteoarthritis, unspecified site: Secondary | ICD-10-CM | POA: Diagnosis not present

## 2016-01-09 DIAGNOSIS — R269 Unspecified abnormalities of gait and mobility: Secondary | ICD-10-CM | POA: Diagnosis not present

## 2016-02-09 DIAGNOSIS — M1990 Unspecified osteoarthritis, unspecified site: Secondary | ICD-10-CM | POA: Diagnosis not present

## 2016-02-09 DIAGNOSIS — R269 Unspecified abnormalities of gait and mobility: Secondary | ICD-10-CM | POA: Diagnosis not present

## 2016-02-13 DIAGNOSIS — Z23 Encounter for immunization: Secondary | ICD-10-CM | POA: Diagnosis not present

## 2016-02-13 DIAGNOSIS — I10 Essential (primary) hypertension: Secondary | ICD-10-CM | POA: Diagnosis not present

## 2016-02-13 DIAGNOSIS — Z760 Encounter for issue of repeat prescription: Secondary | ICD-10-CM | POA: Diagnosis not present

## 2016-02-13 DIAGNOSIS — Z0000 Encounter for general adult medical examination without abnormal findings: Secondary | ICD-10-CM | POA: Diagnosis not present

## 2016-02-13 DIAGNOSIS — G301 Alzheimer's disease with late onset: Secondary | ICD-10-CM | POA: Diagnosis not present

## 2016-02-13 DIAGNOSIS — Z9181 History of falling: Secondary | ICD-10-CM | POA: Diagnosis not present

## 2016-02-13 DIAGNOSIS — R300 Dysuria: Secondary | ICD-10-CM | POA: Diagnosis not present

## 2016-02-13 DIAGNOSIS — F0281 Dementia in other diseases classified elsewhere with behavioral disturbance: Secondary | ICD-10-CM | POA: Diagnosis not present

## 2016-02-17 DIAGNOSIS — R300 Dysuria: Secondary | ICD-10-CM | POA: Diagnosis not present

## 2016-03-10 DIAGNOSIS — M1990 Unspecified osteoarthritis, unspecified site: Secondary | ICD-10-CM | POA: Diagnosis not present

## 2016-03-10 DIAGNOSIS — R269 Unspecified abnormalities of gait and mobility: Secondary | ICD-10-CM | POA: Diagnosis not present

## 2016-04-10 DIAGNOSIS — R269 Unspecified abnormalities of gait and mobility: Secondary | ICD-10-CM | POA: Diagnosis not present

## 2016-04-10 DIAGNOSIS — M1990 Unspecified osteoarthritis, unspecified site: Secondary | ICD-10-CM | POA: Diagnosis not present

## 2016-05-11 DIAGNOSIS — R269 Unspecified abnormalities of gait and mobility: Secondary | ICD-10-CM | POA: Diagnosis not present

## 2016-05-11 DIAGNOSIS — M1990 Unspecified osteoarthritis, unspecified site: Secondary | ICD-10-CM | POA: Diagnosis not present

## 2016-06-08 DIAGNOSIS — R269 Unspecified abnormalities of gait and mobility: Secondary | ICD-10-CM | POA: Diagnosis not present

## 2016-06-08 DIAGNOSIS — M1990 Unspecified osteoarthritis, unspecified site: Secondary | ICD-10-CM | POA: Diagnosis not present

## 2016-07-09 DIAGNOSIS — M1990 Unspecified osteoarthritis, unspecified site: Secondary | ICD-10-CM | POA: Diagnosis not present

## 2016-07-09 DIAGNOSIS — R269 Unspecified abnormalities of gait and mobility: Secondary | ICD-10-CM | POA: Diagnosis not present

## 2016-07-15 DIAGNOSIS — Z7689 Persons encountering health services in other specified circumstances: Secondary | ICD-10-CM | POA: Diagnosis not present

## 2016-07-15 DIAGNOSIS — R350 Frequency of micturition: Secondary | ICD-10-CM | POA: Diagnosis not present

## 2016-07-15 DIAGNOSIS — J069 Acute upper respiratory infection, unspecified: Secondary | ICD-10-CM | POA: Diagnosis not present

## 2016-07-15 DIAGNOSIS — Z6823 Body mass index (BMI) 23.0-23.9, adult: Secondary | ICD-10-CM | POA: Diagnosis not present

## 2016-07-16 DIAGNOSIS — Z8679 Personal history of other diseases of the circulatory system: Secondary | ICD-10-CM | POA: Diagnosis not present

## 2016-07-16 DIAGNOSIS — I679 Cerebrovascular disease, unspecified: Secondary | ICD-10-CM | POA: Diagnosis not present

## 2016-07-16 DIAGNOSIS — I252 Old myocardial infarction: Secondary | ICD-10-CM | POA: Diagnosis not present

## 2016-07-16 DIAGNOSIS — G309 Alzheimer's disease, unspecified: Secondary | ICD-10-CM | POA: Diagnosis not present

## 2016-07-16 DIAGNOSIS — Z8639 Personal history of other endocrine, nutritional and metabolic disease: Secondary | ICD-10-CM | POA: Diagnosis not present

## 2016-07-18 DIAGNOSIS — Z8679 Personal history of other diseases of the circulatory system: Secondary | ICD-10-CM | POA: Diagnosis not present

## 2016-07-18 DIAGNOSIS — G309 Alzheimer's disease, unspecified: Secondary | ICD-10-CM | POA: Diagnosis not present

## 2016-07-18 DIAGNOSIS — I252 Old myocardial infarction: Secondary | ICD-10-CM | POA: Diagnosis not present

## 2016-07-18 DIAGNOSIS — I679 Cerebrovascular disease, unspecified: Secondary | ICD-10-CM | POA: Diagnosis not present

## 2016-07-18 DIAGNOSIS — Z8639 Personal history of other endocrine, nutritional and metabolic disease: Secondary | ICD-10-CM | POA: Diagnosis not present

## 2016-07-19 DIAGNOSIS — G309 Alzheimer's disease, unspecified: Secondary | ICD-10-CM | POA: Diagnosis not present

## 2016-07-19 DIAGNOSIS — I679 Cerebrovascular disease, unspecified: Secondary | ICD-10-CM | POA: Diagnosis not present

## 2016-07-19 DIAGNOSIS — I252 Old myocardial infarction: Secondary | ICD-10-CM | POA: Diagnosis not present

## 2016-07-19 DIAGNOSIS — Z8639 Personal history of other endocrine, nutritional and metabolic disease: Secondary | ICD-10-CM | POA: Diagnosis not present

## 2016-07-19 DIAGNOSIS — Z8679 Personal history of other diseases of the circulatory system: Secondary | ICD-10-CM | POA: Diagnosis not present

## 2016-07-20 DIAGNOSIS — G309 Alzheimer's disease, unspecified: Secondary | ICD-10-CM | POA: Diagnosis not present

## 2016-07-20 DIAGNOSIS — I252 Old myocardial infarction: Secondary | ICD-10-CM | POA: Diagnosis not present

## 2016-07-20 DIAGNOSIS — Z8639 Personal history of other endocrine, nutritional and metabolic disease: Secondary | ICD-10-CM | POA: Diagnosis not present

## 2016-07-20 DIAGNOSIS — I679 Cerebrovascular disease, unspecified: Secondary | ICD-10-CM | POA: Diagnosis not present

## 2016-07-20 DIAGNOSIS — Z8679 Personal history of other diseases of the circulatory system: Secondary | ICD-10-CM | POA: Diagnosis not present

## 2016-07-22 DIAGNOSIS — G309 Alzheimer's disease, unspecified: Secondary | ICD-10-CM | POA: Diagnosis not present

## 2016-07-22 DIAGNOSIS — Z8679 Personal history of other diseases of the circulatory system: Secondary | ICD-10-CM | POA: Diagnosis not present

## 2016-07-22 DIAGNOSIS — I252 Old myocardial infarction: Secondary | ICD-10-CM | POA: Diagnosis not present

## 2016-07-22 DIAGNOSIS — I679 Cerebrovascular disease, unspecified: Secondary | ICD-10-CM | POA: Diagnosis not present

## 2016-07-22 DIAGNOSIS — Z8639 Personal history of other endocrine, nutritional and metabolic disease: Secondary | ICD-10-CM | POA: Diagnosis not present

## 2016-07-23 DIAGNOSIS — G301 Alzheimer's disease with late onset: Secondary | ICD-10-CM | POA: Diagnosis not present

## 2016-07-23 DIAGNOSIS — I252 Old myocardial infarction: Secondary | ICD-10-CM | POA: Diagnosis not present

## 2016-07-23 DIAGNOSIS — I679 Cerebrovascular disease, unspecified: Secondary | ICD-10-CM | POA: Diagnosis not present

## 2016-07-23 DIAGNOSIS — Z8679 Personal history of other diseases of the circulatory system: Secondary | ICD-10-CM | POA: Diagnosis not present

## 2016-07-23 DIAGNOSIS — Z8639 Personal history of other endocrine, nutritional and metabolic disease: Secondary | ICD-10-CM | POA: Diagnosis not present

## 2016-07-23 DIAGNOSIS — G309 Alzheimer's disease, unspecified: Secondary | ICD-10-CM | POA: Diagnosis not present

## 2016-07-26 DIAGNOSIS — I252 Old myocardial infarction: Secondary | ICD-10-CM | POA: Diagnosis not present

## 2016-07-26 DIAGNOSIS — Z8639 Personal history of other endocrine, nutritional and metabolic disease: Secondary | ICD-10-CM | POA: Diagnosis not present

## 2016-07-26 DIAGNOSIS — Z8679 Personal history of other diseases of the circulatory system: Secondary | ICD-10-CM | POA: Diagnosis not present

## 2016-07-26 DIAGNOSIS — I679 Cerebrovascular disease, unspecified: Secondary | ICD-10-CM | POA: Diagnosis not present

## 2016-07-26 DIAGNOSIS — G309 Alzheimer's disease, unspecified: Secondary | ICD-10-CM | POA: Diagnosis not present

## 2016-07-27 DIAGNOSIS — Z8639 Personal history of other endocrine, nutritional and metabolic disease: Secondary | ICD-10-CM | POA: Diagnosis not present

## 2016-07-27 DIAGNOSIS — I252 Old myocardial infarction: Secondary | ICD-10-CM | POA: Diagnosis not present

## 2016-07-27 DIAGNOSIS — Z8679 Personal history of other diseases of the circulatory system: Secondary | ICD-10-CM | POA: Diagnosis not present

## 2016-07-27 DIAGNOSIS — I679 Cerebrovascular disease, unspecified: Secondary | ICD-10-CM | POA: Diagnosis not present

## 2016-07-27 DIAGNOSIS — G309 Alzheimer's disease, unspecified: Secondary | ICD-10-CM | POA: Diagnosis not present

## 2016-07-28 DIAGNOSIS — I252 Old myocardial infarction: Secondary | ICD-10-CM | POA: Diagnosis not present

## 2016-07-28 DIAGNOSIS — Z8639 Personal history of other endocrine, nutritional and metabolic disease: Secondary | ICD-10-CM | POA: Diagnosis not present

## 2016-07-28 DIAGNOSIS — I679 Cerebrovascular disease, unspecified: Secondary | ICD-10-CM | POA: Diagnosis not present

## 2016-07-28 DIAGNOSIS — Z8679 Personal history of other diseases of the circulatory system: Secondary | ICD-10-CM | POA: Diagnosis not present

## 2016-07-28 DIAGNOSIS — G309 Alzheimer's disease, unspecified: Secondary | ICD-10-CM | POA: Diagnosis not present

## 2016-07-30 DIAGNOSIS — I679 Cerebrovascular disease, unspecified: Secondary | ICD-10-CM | POA: Diagnosis not present

## 2016-07-30 DIAGNOSIS — G309 Alzheimer's disease, unspecified: Secondary | ICD-10-CM | POA: Diagnosis not present

## 2016-07-30 DIAGNOSIS — I252 Old myocardial infarction: Secondary | ICD-10-CM | POA: Diagnosis not present

## 2016-07-30 DIAGNOSIS — Z8639 Personal history of other endocrine, nutritional and metabolic disease: Secondary | ICD-10-CM | POA: Diagnosis not present

## 2016-07-30 DIAGNOSIS — Z8679 Personal history of other diseases of the circulatory system: Secondary | ICD-10-CM | POA: Diagnosis not present

## 2016-08-02 DIAGNOSIS — Z8679 Personal history of other diseases of the circulatory system: Secondary | ICD-10-CM | POA: Diagnosis not present

## 2016-08-02 DIAGNOSIS — I252 Old myocardial infarction: Secondary | ICD-10-CM | POA: Diagnosis not present

## 2016-08-02 DIAGNOSIS — Z8639 Personal history of other endocrine, nutritional and metabolic disease: Secondary | ICD-10-CM | POA: Diagnosis not present

## 2016-08-02 DIAGNOSIS — I679 Cerebrovascular disease, unspecified: Secondary | ICD-10-CM | POA: Diagnosis not present

## 2016-08-02 DIAGNOSIS — G309 Alzheimer's disease, unspecified: Secondary | ICD-10-CM | POA: Diagnosis not present

## 2016-08-03 DIAGNOSIS — Z8679 Personal history of other diseases of the circulatory system: Secondary | ICD-10-CM | POA: Diagnosis not present

## 2016-08-03 DIAGNOSIS — Z8639 Personal history of other endocrine, nutritional and metabolic disease: Secondary | ICD-10-CM | POA: Diagnosis not present

## 2016-08-03 DIAGNOSIS — G309 Alzheimer's disease, unspecified: Secondary | ICD-10-CM | POA: Diagnosis not present

## 2016-08-03 DIAGNOSIS — I252 Old myocardial infarction: Secondary | ICD-10-CM | POA: Diagnosis not present

## 2016-08-03 DIAGNOSIS — I679 Cerebrovascular disease, unspecified: Secondary | ICD-10-CM | POA: Diagnosis not present

## 2016-08-04 DIAGNOSIS — Z8679 Personal history of other diseases of the circulatory system: Secondary | ICD-10-CM | POA: Diagnosis not present

## 2016-08-04 DIAGNOSIS — G309 Alzheimer's disease, unspecified: Secondary | ICD-10-CM | POA: Diagnosis not present

## 2016-08-04 DIAGNOSIS — I679 Cerebrovascular disease, unspecified: Secondary | ICD-10-CM | POA: Diagnosis not present

## 2016-08-04 DIAGNOSIS — Z8639 Personal history of other endocrine, nutritional and metabolic disease: Secondary | ICD-10-CM | POA: Diagnosis not present

## 2016-08-04 DIAGNOSIS — I252 Old myocardial infarction: Secondary | ICD-10-CM | POA: Diagnosis not present

## 2016-08-05 DIAGNOSIS — G309 Alzheimer's disease, unspecified: Secondary | ICD-10-CM | POA: Diagnosis not present

## 2016-08-08 DIAGNOSIS — M1990 Unspecified osteoarthritis, unspecified site: Secondary | ICD-10-CM | POA: Diagnosis not present

## 2016-08-08 DIAGNOSIS — R269 Unspecified abnormalities of gait and mobility: Secondary | ICD-10-CM | POA: Diagnosis not present

## 2016-08-10 DIAGNOSIS — I679 Cerebrovascular disease, unspecified: Secondary | ICD-10-CM | POA: Diagnosis not present

## 2016-08-10 DIAGNOSIS — I252 Old myocardial infarction: Secondary | ICD-10-CM | POA: Diagnosis not present

## 2016-08-10 DIAGNOSIS — Z8679 Personal history of other diseases of the circulatory system: Secondary | ICD-10-CM | POA: Diagnosis not present

## 2016-08-10 DIAGNOSIS — G309 Alzheimer's disease, unspecified: Secondary | ICD-10-CM | POA: Diagnosis not present

## 2016-08-10 DIAGNOSIS — Z8639 Personal history of other endocrine, nutritional and metabolic disease: Secondary | ICD-10-CM | POA: Diagnosis not present

## 2016-08-12 DIAGNOSIS — Z8679 Personal history of other diseases of the circulatory system: Secondary | ICD-10-CM | POA: Diagnosis not present

## 2016-08-12 DIAGNOSIS — Z8639 Personal history of other endocrine, nutritional and metabolic disease: Secondary | ICD-10-CM | POA: Diagnosis not present

## 2016-08-12 DIAGNOSIS — I679 Cerebrovascular disease, unspecified: Secondary | ICD-10-CM | POA: Diagnosis not present

## 2016-08-12 DIAGNOSIS — G309 Alzheimer's disease, unspecified: Secondary | ICD-10-CM | POA: Diagnosis not present

## 2016-08-12 DIAGNOSIS — I252 Old myocardial infarction: Secondary | ICD-10-CM | POA: Diagnosis not present

## 2016-08-13 DIAGNOSIS — Z8639 Personal history of other endocrine, nutritional and metabolic disease: Secondary | ICD-10-CM | POA: Diagnosis not present

## 2016-08-13 DIAGNOSIS — I679 Cerebrovascular disease, unspecified: Secondary | ICD-10-CM | POA: Diagnosis not present

## 2016-08-13 DIAGNOSIS — I252 Old myocardial infarction: Secondary | ICD-10-CM | POA: Diagnosis not present

## 2016-08-13 DIAGNOSIS — Z8679 Personal history of other diseases of the circulatory system: Secondary | ICD-10-CM | POA: Diagnosis not present

## 2016-08-13 DIAGNOSIS — G309 Alzheimer's disease, unspecified: Secondary | ICD-10-CM | POA: Diagnosis not present

## 2016-08-17 DIAGNOSIS — Z8679 Personal history of other diseases of the circulatory system: Secondary | ICD-10-CM | POA: Diagnosis not present

## 2016-08-17 DIAGNOSIS — G309 Alzheimer's disease, unspecified: Secondary | ICD-10-CM | POA: Diagnosis not present

## 2016-08-17 DIAGNOSIS — I679 Cerebrovascular disease, unspecified: Secondary | ICD-10-CM | POA: Diagnosis not present

## 2016-08-17 DIAGNOSIS — I252 Old myocardial infarction: Secondary | ICD-10-CM | POA: Diagnosis not present

## 2016-08-17 DIAGNOSIS — Z8639 Personal history of other endocrine, nutritional and metabolic disease: Secondary | ICD-10-CM | POA: Diagnosis not present

## 2016-08-18 DIAGNOSIS — I252 Old myocardial infarction: Secondary | ICD-10-CM | POA: Diagnosis not present

## 2016-08-18 DIAGNOSIS — I679 Cerebrovascular disease, unspecified: Secondary | ICD-10-CM | POA: Diagnosis not present

## 2016-08-18 DIAGNOSIS — G309 Alzheimer's disease, unspecified: Secondary | ICD-10-CM | POA: Diagnosis not present

## 2016-08-18 DIAGNOSIS — Z8679 Personal history of other diseases of the circulatory system: Secondary | ICD-10-CM | POA: Diagnosis not present

## 2016-08-18 DIAGNOSIS — Z8639 Personal history of other endocrine, nutritional and metabolic disease: Secondary | ICD-10-CM | POA: Diagnosis not present

## 2016-08-19 DIAGNOSIS — Z8639 Personal history of other endocrine, nutritional and metabolic disease: Secondary | ICD-10-CM | POA: Diagnosis not present

## 2016-08-19 DIAGNOSIS — I252 Old myocardial infarction: Secondary | ICD-10-CM | POA: Diagnosis not present

## 2016-08-19 DIAGNOSIS — G309 Alzheimer's disease, unspecified: Secondary | ICD-10-CM | POA: Diagnosis not present

## 2016-08-19 DIAGNOSIS — R350 Frequency of micturition: Secondary | ICD-10-CM | POA: Diagnosis not present

## 2016-08-19 DIAGNOSIS — Z8679 Personal history of other diseases of the circulatory system: Secondary | ICD-10-CM | POA: Diagnosis not present

## 2016-08-19 DIAGNOSIS — I679 Cerebrovascular disease, unspecified: Secondary | ICD-10-CM | POA: Diagnosis not present

## 2016-08-23 DIAGNOSIS — Z8679 Personal history of other diseases of the circulatory system: Secondary | ICD-10-CM | POA: Diagnosis not present

## 2016-08-23 DIAGNOSIS — Z8639 Personal history of other endocrine, nutritional and metabolic disease: Secondary | ICD-10-CM | POA: Diagnosis not present

## 2016-08-23 DIAGNOSIS — G309 Alzheimer's disease, unspecified: Secondary | ICD-10-CM | POA: Diagnosis not present

## 2016-08-23 DIAGNOSIS — I252 Old myocardial infarction: Secondary | ICD-10-CM | POA: Diagnosis not present

## 2016-08-23 DIAGNOSIS — I679 Cerebrovascular disease, unspecified: Secondary | ICD-10-CM | POA: Diagnosis not present

## 2016-08-24 DIAGNOSIS — Z8679 Personal history of other diseases of the circulatory system: Secondary | ICD-10-CM | POA: Diagnosis not present

## 2016-08-24 DIAGNOSIS — Z8639 Personal history of other endocrine, nutritional and metabolic disease: Secondary | ICD-10-CM | POA: Diagnosis not present

## 2016-08-24 DIAGNOSIS — I252 Old myocardial infarction: Secondary | ICD-10-CM | POA: Diagnosis not present

## 2016-08-24 DIAGNOSIS — G309 Alzheimer's disease, unspecified: Secondary | ICD-10-CM | POA: Diagnosis not present

## 2016-08-24 DIAGNOSIS — I679 Cerebrovascular disease, unspecified: Secondary | ICD-10-CM | POA: Diagnosis not present

## 2016-08-26 DIAGNOSIS — Z8679 Personal history of other diseases of the circulatory system: Secondary | ICD-10-CM | POA: Diagnosis not present

## 2016-08-26 DIAGNOSIS — I679 Cerebrovascular disease, unspecified: Secondary | ICD-10-CM | POA: Diagnosis not present

## 2016-08-26 DIAGNOSIS — G309 Alzheimer's disease, unspecified: Secondary | ICD-10-CM | POA: Diagnosis not present

## 2016-08-26 DIAGNOSIS — Z8639 Personal history of other endocrine, nutritional and metabolic disease: Secondary | ICD-10-CM | POA: Diagnosis not present

## 2016-08-26 DIAGNOSIS — I252 Old myocardial infarction: Secondary | ICD-10-CM | POA: Diagnosis not present

## 2016-08-31 DIAGNOSIS — I252 Old myocardial infarction: Secondary | ICD-10-CM | POA: Diagnosis not present

## 2016-08-31 DIAGNOSIS — Z8639 Personal history of other endocrine, nutritional and metabolic disease: Secondary | ICD-10-CM | POA: Diagnosis not present

## 2016-08-31 DIAGNOSIS — I679 Cerebrovascular disease, unspecified: Secondary | ICD-10-CM | POA: Diagnosis not present

## 2016-08-31 DIAGNOSIS — G309 Alzheimer's disease, unspecified: Secondary | ICD-10-CM | POA: Diagnosis not present

## 2016-08-31 DIAGNOSIS — Z8679 Personal history of other diseases of the circulatory system: Secondary | ICD-10-CM | POA: Diagnosis not present

## 2016-09-01 DIAGNOSIS — Z8679 Personal history of other diseases of the circulatory system: Secondary | ICD-10-CM | POA: Diagnosis not present

## 2016-09-01 DIAGNOSIS — G309 Alzheimer's disease, unspecified: Secondary | ICD-10-CM | POA: Diagnosis not present

## 2016-09-01 DIAGNOSIS — I679 Cerebrovascular disease, unspecified: Secondary | ICD-10-CM | POA: Diagnosis not present

## 2016-09-01 DIAGNOSIS — I252 Old myocardial infarction: Secondary | ICD-10-CM | POA: Diagnosis not present

## 2016-09-01 DIAGNOSIS — Z8639 Personal history of other endocrine, nutritional and metabolic disease: Secondary | ICD-10-CM | POA: Diagnosis not present

## 2016-09-02 DIAGNOSIS — I679 Cerebrovascular disease, unspecified: Secondary | ICD-10-CM | POA: Diagnosis not present

## 2016-09-02 DIAGNOSIS — G309 Alzheimer's disease, unspecified: Secondary | ICD-10-CM | POA: Diagnosis not present

## 2016-09-02 DIAGNOSIS — Z8679 Personal history of other diseases of the circulatory system: Secondary | ICD-10-CM | POA: Diagnosis not present

## 2016-09-02 DIAGNOSIS — Z8639 Personal history of other endocrine, nutritional and metabolic disease: Secondary | ICD-10-CM | POA: Diagnosis not present

## 2016-09-02 DIAGNOSIS — I252 Old myocardial infarction: Secondary | ICD-10-CM | POA: Diagnosis not present

## 2016-09-06 DIAGNOSIS — Z8679 Personal history of other diseases of the circulatory system: Secondary | ICD-10-CM | POA: Diagnosis not present

## 2016-09-06 DIAGNOSIS — G309 Alzheimer's disease, unspecified: Secondary | ICD-10-CM | POA: Diagnosis not present

## 2016-09-06 DIAGNOSIS — I679 Cerebrovascular disease, unspecified: Secondary | ICD-10-CM | POA: Diagnosis not present

## 2016-09-06 DIAGNOSIS — I252 Old myocardial infarction: Secondary | ICD-10-CM | POA: Diagnosis not present

## 2016-09-06 DIAGNOSIS — Z8639 Personal history of other endocrine, nutritional and metabolic disease: Secondary | ICD-10-CM | POA: Diagnosis not present

## 2016-09-08 DIAGNOSIS — R269 Unspecified abnormalities of gait and mobility: Secondary | ICD-10-CM | POA: Diagnosis not present

## 2016-09-08 DIAGNOSIS — M1990 Unspecified osteoarthritis, unspecified site: Secondary | ICD-10-CM | POA: Diagnosis not present

## 2016-09-09 DIAGNOSIS — I252 Old myocardial infarction: Secondary | ICD-10-CM | POA: Diagnosis not present

## 2016-09-09 DIAGNOSIS — Z8679 Personal history of other diseases of the circulatory system: Secondary | ICD-10-CM | POA: Diagnosis not present

## 2016-09-09 DIAGNOSIS — G309 Alzheimer's disease, unspecified: Secondary | ICD-10-CM | POA: Diagnosis not present

## 2016-09-09 DIAGNOSIS — Z8639 Personal history of other endocrine, nutritional and metabolic disease: Secondary | ICD-10-CM | POA: Diagnosis not present

## 2016-09-09 DIAGNOSIS — I679 Cerebrovascular disease, unspecified: Secondary | ICD-10-CM | POA: Diagnosis not present

## 2016-09-12 DIAGNOSIS — I679 Cerebrovascular disease, unspecified: Secondary | ICD-10-CM | POA: Diagnosis not present

## 2016-09-12 DIAGNOSIS — I252 Old myocardial infarction: Secondary | ICD-10-CM | POA: Diagnosis not present

## 2016-09-12 DIAGNOSIS — Z8679 Personal history of other diseases of the circulatory system: Secondary | ICD-10-CM | POA: Diagnosis not present

## 2016-09-12 DIAGNOSIS — G309 Alzheimer's disease, unspecified: Secondary | ICD-10-CM | POA: Diagnosis not present

## 2016-09-12 DIAGNOSIS — Z8639 Personal history of other endocrine, nutritional and metabolic disease: Secondary | ICD-10-CM | POA: Diagnosis not present

## 2016-09-13 DIAGNOSIS — I252 Old myocardial infarction: Secondary | ICD-10-CM | POA: Diagnosis not present

## 2016-09-13 DIAGNOSIS — Z8639 Personal history of other endocrine, nutritional and metabolic disease: Secondary | ICD-10-CM | POA: Diagnosis not present

## 2016-09-13 DIAGNOSIS — G309 Alzheimer's disease, unspecified: Secondary | ICD-10-CM | POA: Diagnosis not present

## 2016-09-13 DIAGNOSIS — I679 Cerebrovascular disease, unspecified: Secondary | ICD-10-CM | POA: Diagnosis not present

## 2016-09-13 DIAGNOSIS — Z8679 Personal history of other diseases of the circulatory system: Secondary | ICD-10-CM | POA: Diagnosis not present

## 2016-09-13 IMAGING — CT CT HEAD W/O CM
3 series · 15 of 46 positions shown, 18 images · non-contrast
Comparison: CT scan of head March 10, 2015.

CLINICAL DATA: Left-sided weakness, aphasia.

EXAM:
CT HEAD WITHOUT CONTRAST
TECHNIQUE: Contiguous axial images were obtained from the base of the skull
through the vertex without intravenous contrast.

[Series 2: head 5.0 st · axial · 0.44mm/px · z∈[-290,-170]mm · 9 of 29 slices shown, 12 images]
[im 3/29  brain]
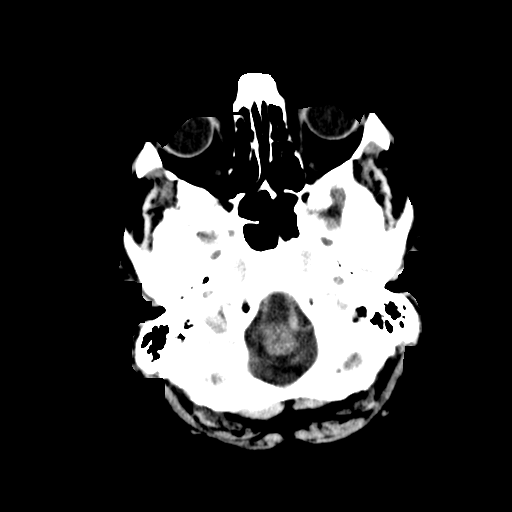
[im 3/29  bone]
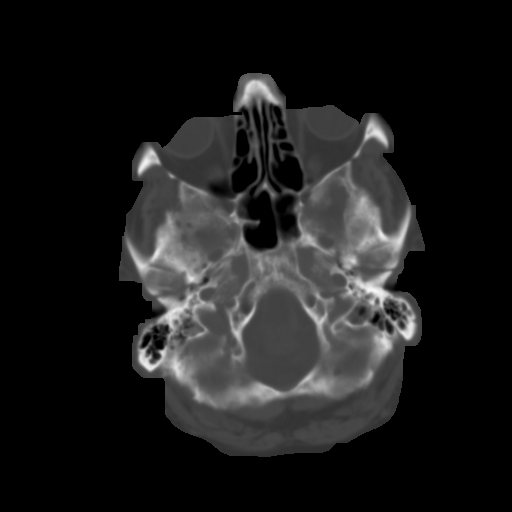
[im 6/29  brain]
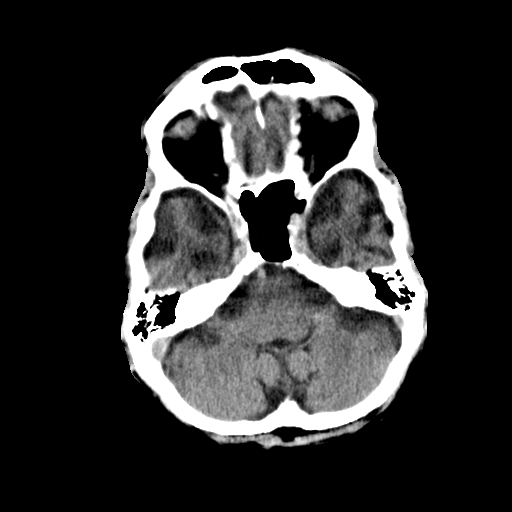
[im 9/29  brain]
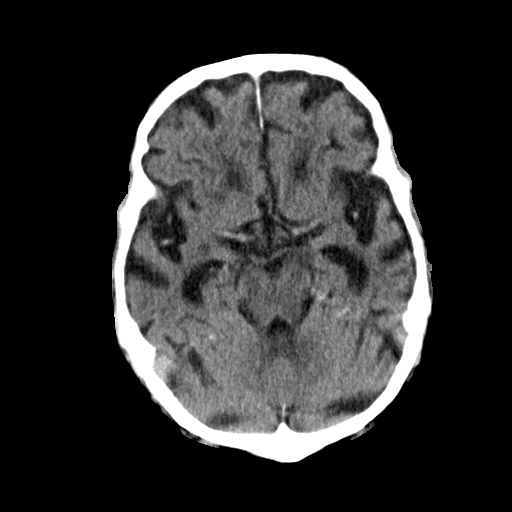
[im 12/29  brain]
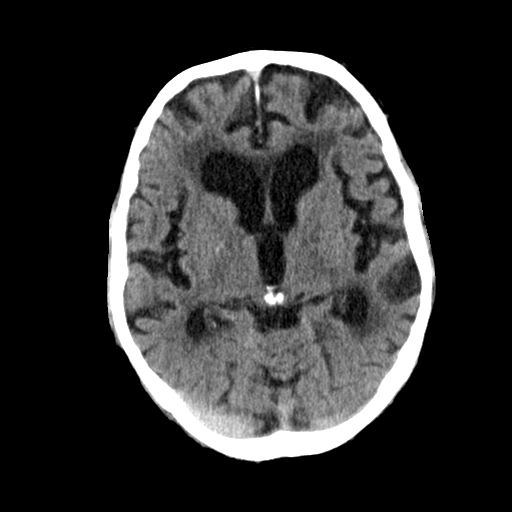
[im 15/29  brain]
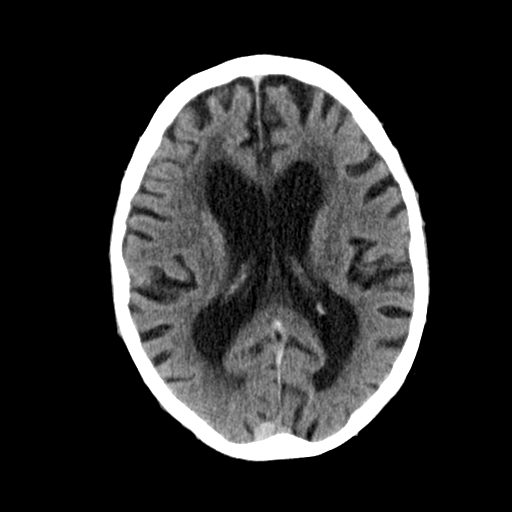
[im 15/29  bone]
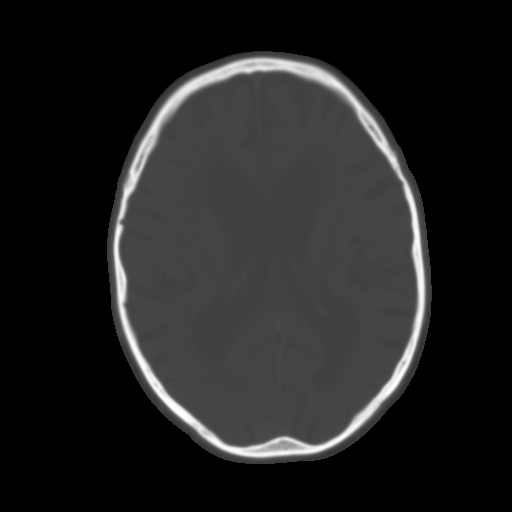
[im 18/29  brain]
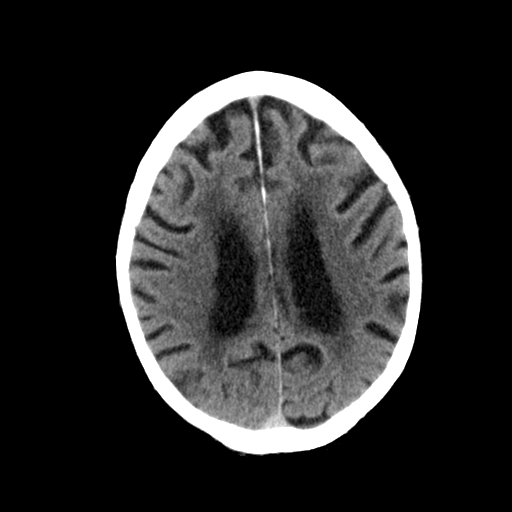
[im 21/29  brain]
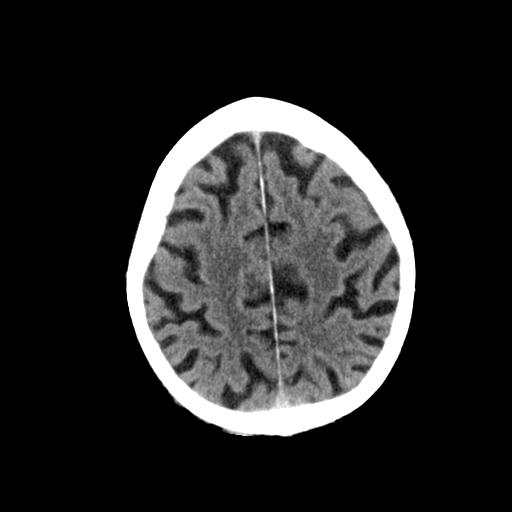
[im 24/29  brain]
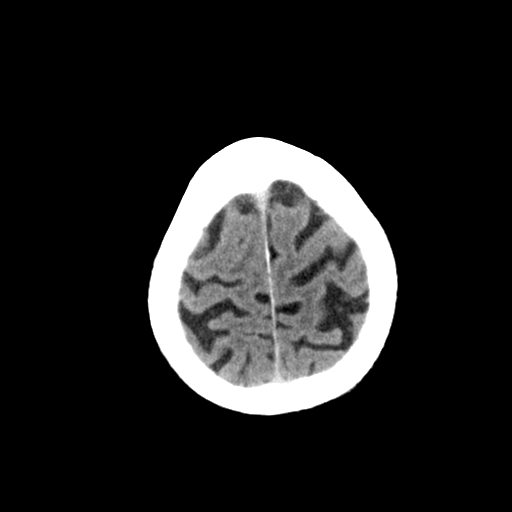
[im 27/29  brain]
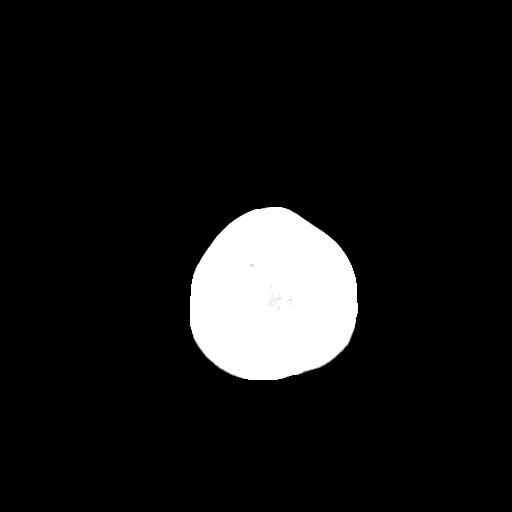
[im 27/29  bone]
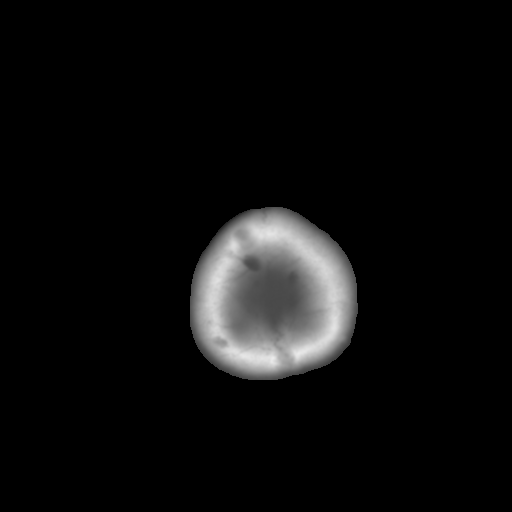

[Series 4: head 3.0 cor st · coronal · 0.30mm/px · 3 of 61 slices shown]
[im 21/61  brain]
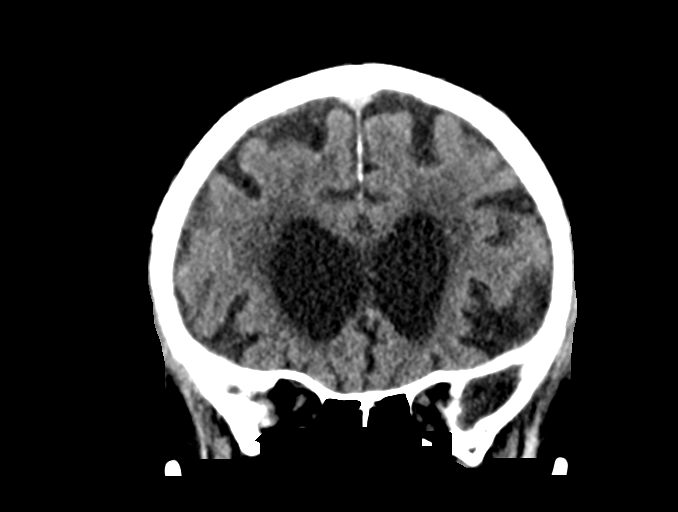
[im 27/61  brain]
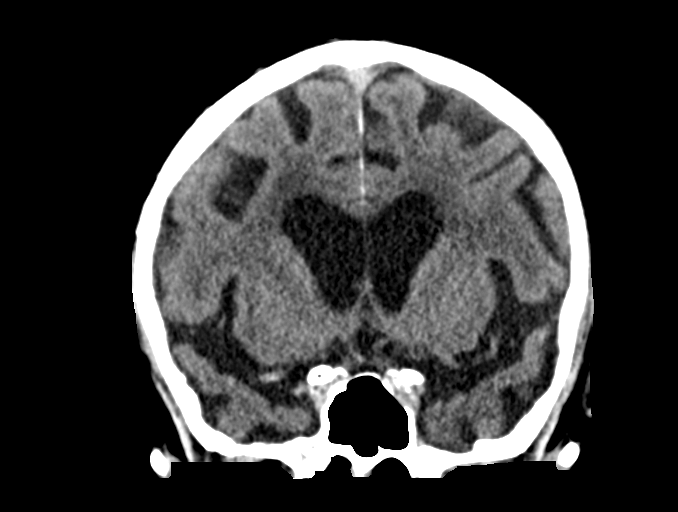
[im 34/61  brain]
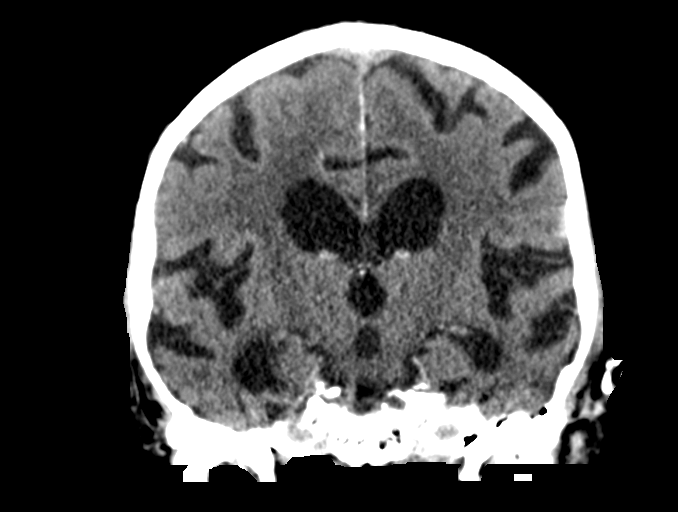

[Series 5: head 3.0 sag st · sagittal · 0.30mm/px · 3 of 48 slices shown]
[im 16/48  brain]
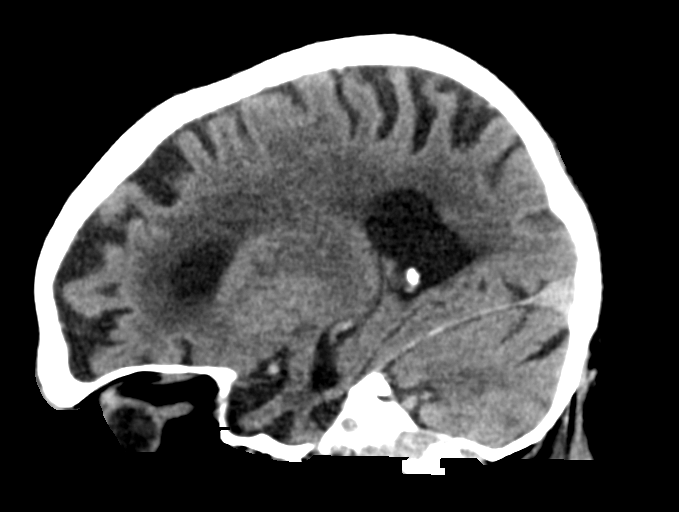
[im 24/48  brain]
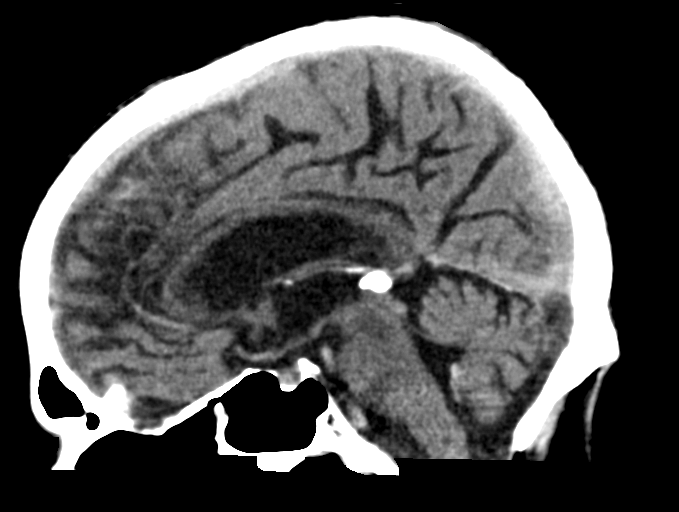
[im 32/48  brain]
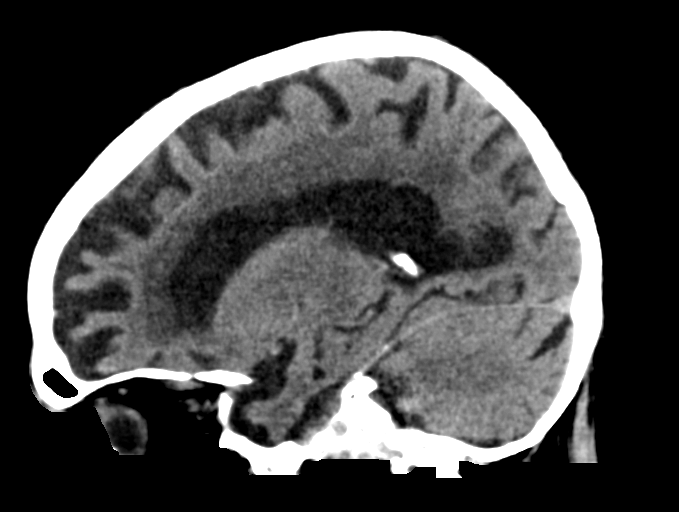

[15 of 46 positions shown; findings below may reference images not displayed]

FINDINGS: Bony calvarium appears intact. Moderate diffuse cortical atrophy is
noted. Mild chronic ischemic white matter disease is noted. No mass
effect or midline shift is noted. Ventricular size is within normal
limits. There is no evidence of mass lesion, hemorrhage or acute
infarction.
IMPRESSION: Moderate diffuse cortical atrophy. Mild chronic ischemic white
matter disease. No acute intracranial abnormality seen. These
results were called by telephone at the time of interpretation on
08/28/2015 at [DATE] to Dr. Nurry, who verbally acknowledged
these results.

## 2016-09-16 DIAGNOSIS — I679 Cerebrovascular disease, unspecified: Secondary | ICD-10-CM | POA: Diagnosis not present

## 2016-09-16 DIAGNOSIS — I252 Old myocardial infarction: Secondary | ICD-10-CM | POA: Diagnosis not present

## 2016-09-16 DIAGNOSIS — Z8639 Personal history of other endocrine, nutritional and metabolic disease: Secondary | ICD-10-CM | POA: Diagnosis not present

## 2016-09-16 DIAGNOSIS — Z8679 Personal history of other diseases of the circulatory system: Secondary | ICD-10-CM | POA: Diagnosis not present

## 2016-09-16 DIAGNOSIS — G309 Alzheimer's disease, unspecified: Secondary | ICD-10-CM | POA: Diagnosis not present

## 2016-09-20 DIAGNOSIS — I679 Cerebrovascular disease, unspecified: Secondary | ICD-10-CM | POA: Diagnosis not present

## 2016-09-20 DIAGNOSIS — Z8639 Personal history of other endocrine, nutritional and metabolic disease: Secondary | ICD-10-CM | POA: Diagnosis not present

## 2016-09-20 DIAGNOSIS — Z8679 Personal history of other diseases of the circulatory system: Secondary | ICD-10-CM | POA: Diagnosis not present

## 2016-09-20 DIAGNOSIS — I252 Old myocardial infarction: Secondary | ICD-10-CM | POA: Diagnosis not present

## 2016-09-20 DIAGNOSIS — G309 Alzheimer's disease, unspecified: Secondary | ICD-10-CM | POA: Diagnosis not present

## 2016-09-21 DIAGNOSIS — G309 Alzheimer's disease, unspecified: Secondary | ICD-10-CM | POA: Diagnosis not present

## 2016-09-21 DIAGNOSIS — I679 Cerebrovascular disease, unspecified: Secondary | ICD-10-CM | POA: Diagnosis not present

## 2016-09-21 DIAGNOSIS — Z8679 Personal history of other diseases of the circulatory system: Secondary | ICD-10-CM | POA: Diagnosis not present

## 2016-09-21 DIAGNOSIS — Z8639 Personal history of other endocrine, nutritional and metabolic disease: Secondary | ICD-10-CM | POA: Diagnosis not present

## 2016-09-21 DIAGNOSIS — I252 Old myocardial infarction: Secondary | ICD-10-CM | POA: Diagnosis not present

## 2016-09-23 DIAGNOSIS — G309 Alzheimer's disease, unspecified: Secondary | ICD-10-CM | POA: Diagnosis not present

## 2016-09-23 DIAGNOSIS — Z8679 Personal history of other diseases of the circulatory system: Secondary | ICD-10-CM | POA: Diagnosis not present

## 2016-09-23 DIAGNOSIS — Z8639 Personal history of other endocrine, nutritional and metabolic disease: Secondary | ICD-10-CM | POA: Diagnosis not present

## 2016-09-23 DIAGNOSIS — I679 Cerebrovascular disease, unspecified: Secondary | ICD-10-CM | POA: Diagnosis not present

## 2016-09-23 DIAGNOSIS — I252 Old myocardial infarction: Secondary | ICD-10-CM | POA: Diagnosis not present

## 2016-09-27 DIAGNOSIS — Z8679 Personal history of other diseases of the circulatory system: Secondary | ICD-10-CM | POA: Diagnosis not present

## 2016-09-27 DIAGNOSIS — Z8639 Personal history of other endocrine, nutritional and metabolic disease: Secondary | ICD-10-CM | POA: Diagnosis not present

## 2016-09-27 DIAGNOSIS — I252 Old myocardial infarction: Secondary | ICD-10-CM | POA: Diagnosis not present

## 2016-09-27 DIAGNOSIS — I679 Cerebrovascular disease, unspecified: Secondary | ICD-10-CM | POA: Diagnosis not present

## 2016-09-27 DIAGNOSIS — G309 Alzheimer's disease, unspecified: Secondary | ICD-10-CM | POA: Diagnosis not present

## 2016-09-30 DIAGNOSIS — Z8679 Personal history of other diseases of the circulatory system: Secondary | ICD-10-CM | POA: Diagnosis not present

## 2016-09-30 DIAGNOSIS — I252 Old myocardial infarction: Secondary | ICD-10-CM | POA: Diagnosis not present

## 2016-09-30 DIAGNOSIS — Z8639 Personal history of other endocrine, nutritional and metabolic disease: Secondary | ICD-10-CM | POA: Diagnosis not present

## 2016-09-30 DIAGNOSIS — G309 Alzheimer's disease, unspecified: Secondary | ICD-10-CM | POA: Diagnosis not present

## 2016-09-30 DIAGNOSIS — I679 Cerebrovascular disease, unspecified: Secondary | ICD-10-CM | POA: Diagnosis not present

## 2016-10-04 DIAGNOSIS — I252 Old myocardial infarction: Secondary | ICD-10-CM | POA: Diagnosis not present

## 2016-10-04 DIAGNOSIS — G309 Alzheimer's disease, unspecified: Secondary | ICD-10-CM | POA: Diagnosis not present

## 2016-10-04 DIAGNOSIS — I679 Cerebrovascular disease, unspecified: Secondary | ICD-10-CM | POA: Diagnosis not present

## 2016-10-04 DIAGNOSIS — Z8679 Personal history of other diseases of the circulatory system: Secondary | ICD-10-CM | POA: Diagnosis not present

## 2016-10-04 DIAGNOSIS — R350 Frequency of micturition: Secondary | ICD-10-CM | POA: Diagnosis not present

## 2016-10-04 DIAGNOSIS — Z8639 Personal history of other endocrine, nutritional and metabolic disease: Secondary | ICD-10-CM | POA: Diagnosis not present

## 2016-10-06 DIAGNOSIS — Z8679 Personal history of other diseases of the circulatory system: Secondary | ICD-10-CM | POA: Diagnosis not present

## 2016-10-06 DIAGNOSIS — I252 Old myocardial infarction: Secondary | ICD-10-CM | POA: Diagnosis not present

## 2016-10-06 DIAGNOSIS — I679 Cerebrovascular disease, unspecified: Secondary | ICD-10-CM | POA: Diagnosis not present

## 2016-10-06 DIAGNOSIS — G309 Alzheimer's disease, unspecified: Secondary | ICD-10-CM | POA: Diagnosis not present

## 2016-10-06 DIAGNOSIS — Z8639 Personal history of other endocrine, nutritional and metabolic disease: Secondary | ICD-10-CM | POA: Diagnosis not present

## 2016-10-07 DIAGNOSIS — I252 Old myocardial infarction: Secondary | ICD-10-CM | POA: Diagnosis not present

## 2016-10-07 DIAGNOSIS — I679 Cerebrovascular disease, unspecified: Secondary | ICD-10-CM | POA: Diagnosis not present

## 2016-10-07 DIAGNOSIS — G309 Alzheimer's disease, unspecified: Secondary | ICD-10-CM | POA: Diagnosis not present

## 2016-10-07 DIAGNOSIS — Z8679 Personal history of other diseases of the circulatory system: Secondary | ICD-10-CM | POA: Diagnosis not present

## 2016-10-07 DIAGNOSIS — Z8639 Personal history of other endocrine, nutritional and metabolic disease: Secondary | ICD-10-CM | POA: Diagnosis not present

## 2016-10-11 DIAGNOSIS — Z8679 Personal history of other diseases of the circulatory system: Secondary | ICD-10-CM | POA: Diagnosis not present

## 2016-10-11 DIAGNOSIS — G309 Alzheimer's disease, unspecified: Secondary | ICD-10-CM | POA: Diagnosis not present

## 2016-10-11 DIAGNOSIS — Z8639 Personal history of other endocrine, nutritional and metabolic disease: Secondary | ICD-10-CM | POA: Diagnosis not present

## 2016-10-11 DIAGNOSIS — I679 Cerebrovascular disease, unspecified: Secondary | ICD-10-CM | POA: Diagnosis not present

## 2016-10-11 DIAGNOSIS — I252 Old myocardial infarction: Secondary | ICD-10-CM | POA: Diagnosis not present

## 2016-10-13 DIAGNOSIS — I679 Cerebrovascular disease, unspecified: Secondary | ICD-10-CM | POA: Diagnosis not present

## 2016-10-13 DIAGNOSIS — Z8639 Personal history of other endocrine, nutritional and metabolic disease: Secondary | ICD-10-CM | POA: Diagnosis not present

## 2016-10-13 DIAGNOSIS — I252 Old myocardial infarction: Secondary | ICD-10-CM | POA: Diagnosis not present

## 2016-10-13 DIAGNOSIS — G309 Alzheimer's disease, unspecified: Secondary | ICD-10-CM | POA: Diagnosis not present

## 2016-10-13 DIAGNOSIS — Z8679 Personal history of other diseases of the circulatory system: Secondary | ICD-10-CM | POA: Diagnosis not present

## 2016-10-14 DIAGNOSIS — Z8679 Personal history of other diseases of the circulatory system: Secondary | ICD-10-CM | POA: Diagnosis not present

## 2016-10-14 DIAGNOSIS — Z8639 Personal history of other endocrine, nutritional and metabolic disease: Secondary | ICD-10-CM | POA: Diagnosis not present

## 2016-10-14 DIAGNOSIS — I252 Old myocardial infarction: Secondary | ICD-10-CM | POA: Diagnosis not present

## 2016-10-14 DIAGNOSIS — G309 Alzheimer's disease, unspecified: Secondary | ICD-10-CM | POA: Diagnosis not present

## 2016-10-14 DIAGNOSIS — I679 Cerebrovascular disease, unspecified: Secondary | ICD-10-CM | POA: Diagnosis not present

## 2016-10-18 DIAGNOSIS — Z8639 Personal history of other endocrine, nutritional and metabolic disease: Secondary | ICD-10-CM | POA: Diagnosis not present

## 2016-10-18 DIAGNOSIS — G309 Alzheimer's disease, unspecified: Secondary | ICD-10-CM | POA: Diagnosis not present

## 2016-10-18 DIAGNOSIS — I252 Old myocardial infarction: Secondary | ICD-10-CM | POA: Diagnosis not present

## 2016-10-18 DIAGNOSIS — Z8679 Personal history of other diseases of the circulatory system: Secondary | ICD-10-CM | POA: Diagnosis not present

## 2016-10-18 DIAGNOSIS — I679 Cerebrovascular disease, unspecified: Secondary | ICD-10-CM | POA: Diagnosis not present

## 2016-10-19 DIAGNOSIS — Z8639 Personal history of other endocrine, nutritional and metabolic disease: Secondary | ICD-10-CM | POA: Diagnosis not present

## 2016-10-19 DIAGNOSIS — G309 Alzheimer's disease, unspecified: Secondary | ICD-10-CM | POA: Diagnosis not present

## 2016-10-19 DIAGNOSIS — I679 Cerebrovascular disease, unspecified: Secondary | ICD-10-CM | POA: Diagnosis not present

## 2016-10-19 DIAGNOSIS — I252 Old myocardial infarction: Secondary | ICD-10-CM | POA: Diagnosis not present

## 2016-10-19 DIAGNOSIS — Z8679 Personal history of other diseases of the circulatory system: Secondary | ICD-10-CM | POA: Diagnosis not present

## 2016-10-21 DIAGNOSIS — I252 Old myocardial infarction: Secondary | ICD-10-CM | POA: Diagnosis not present

## 2016-10-21 DIAGNOSIS — Z8679 Personal history of other diseases of the circulatory system: Secondary | ICD-10-CM | POA: Diagnosis not present

## 2016-10-21 DIAGNOSIS — I679 Cerebrovascular disease, unspecified: Secondary | ICD-10-CM | POA: Diagnosis not present

## 2016-10-21 DIAGNOSIS — G309 Alzheimer's disease, unspecified: Secondary | ICD-10-CM | POA: Diagnosis not present

## 2016-10-21 DIAGNOSIS — Z8639 Personal history of other endocrine, nutritional and metabolic disease: Secondary | ICD-10-CM | POA: Diagnosis not present

## 2016-10-25 DIAGNOSIS — Z8639 Personal history of other endocrine, nutritional and metabolic disease: Secondary | ICD-10-CM | POA: Diagnosis not present

## 2016-10-25 DIAGNOSIS — G309 Alzheimer's disease, unspecified: Secondary | ICD-10-CM | POA: Diagnosis not present

## 2016-10-25 DIAGNOSIS — I679 Cerebrovascular disease, unspecified: Secondary | ICD-10-CM | POA: Diagnosis not present

## 2016-10-25 DIAGNOSIS — Z8679 Personal history of other diseases of the circulatory system: Secondary | ICD-10-CM | POA: Diagnosis not present

## 2016-10-25 DIAGNOSIS — I252 Old myocardial infarction: Secondary | ICD-10-CM | POA: Diagnosis not present

## 2016-10-26 DIAGNOSIS — I252 Old myocardial infarction: Secondary | ICD-10-CM | POA: Diagnosis not present

## 2016-10-26 DIAGNOSIS — G309 Alzheimer's disease, unspecified: Secondary | ICD-10-CM | POA: Diagnosis not present

## 2016-10-26 DIAGNOSIS — Z8639 Personal history of other endocrine, nutritional and metabolic disease: Secondary | ICD-10-CM | POA: Diagnosis not present

## 2016-10-26 DIAGNOSIS — Z8679 Personal history of other diseases of the circulatory system: Secondary | ICD-10-CM | POA: Diagnosis not present

## 2016-10-26 DIAGNOSIS — I679 Cerebrovascular disease, unspecified: Secondary | ICD-10-CM | POA: Diagnosis not present

## 2016-10-28 DIAGNOSIS — I252 Old myocardial infarction: Secondary | ICD-10-CM | POA: Diagnosis not present

## 2016-10-28 DIAGNOSIS — Z8679 Personal history of other diseases of the circulatory system: Secondary | ICD-10-CM | POA: Diagnosis not present

## 2016-10-28 DIAGNOSIS — I679 Cerebrovascular disease, unspecified: Secondary | ICD-10-CM | POA: Diagnosis not present

## 2016-10-28 DIAGNOSIS — G309 Alzheimer's disease, unspecified: Secondary | ICD-10-CM | POA: Diagnosis not present

## 2016-10-28 DIAGNOSIS — Z8639 Personal history of other endocrine, nutritional and metabolic disease: Secondary | ICD-10-CM | POA: Diagnosis not present

## 2016-11-01 DIAGNOSIS — G309 Alzheimer's disease, unspecified: Secondary | ICD-10-CM | POA: Diagnosis not present

## 2016-11-01 DIAGNOSIS — Z8679 Personal history of other diseases of the circulatory system: Secondary | ICD-10-CM | POA: Diagnosis not present

## 2016-11-01 DIAGNOSIS — I679 Cerebrovascular disease, unspecified: Secondary | ICD-10-CM | POA: Diagnosis not present

## 2016-11-01 DIAGNOSIS — Z8639 Personal history of other endocrine, nutritional and metabolic disease: Secondary | ICD-10-CM | POA: Diagnosis not present

## 2016-11-01 DIAGNOSIS — I252 Old myocardial infarction: Secondary | ICD-10-CM | POA: Diagnosis not present

## 2016-11-02 DIAGNOSIS — G309 Alzheimer's disease, unspecified: Secondary | ICD-10-CM | POA: Diagnosis not present

## 2016-11-02 DIAGNOSIS — I252 Old myocardial infarction: Secondary | ICD-10-CM | POA: Diagnosis not present

## 2016-11-02 DIAGNOSIS — Z8679 Personal history of other diseases of the circulatory system: Secondary | ICD-10-CM | POA: Diagnosis not present

## 2016-11-02 DIAGNOSIS — I679 Cerebrovascular disease, unspecified: Secondary | ICD-10-CM | POA: Diagnosis not present

## 2016-11-02 DIAGNOSIS — Z8639 Personal history of other endocrine, nutritional and metabolic disease: Secondary | ICD-10-CM | POA: Diagnosis not present

## 2016-11-04 DIAGNOSIS — Z8679 Personal history of other diseases of the circulatory system: Secondary | ICD-10-CM | POA: Diagnosis not present

## 2016-11-04 DIAGNOSIS — G309 Alzheimer's disease, unspecified: Secondary | ICD-10-CM | POA: Diagnosis not present

## 2016-11-04 DIAGNOSIS — I679 Cerebrovascular disease, unspecified: Secondary | ICD-10-CM | POA: Diagnosis not present

## 2016-11-04 DIAGNOSIS — I252 Old myocardial infarction: Secondary | ICD-10-CM | POA: Diagnosis not present

## 2016-11-04 DIAGNOSIS — Z8639 Personal history of other endocrine, nutritional and metabolic disease: Secondary | ICD-10-CM | POA: Diagnosis not present

## 2016-11-08 DIAGNOSIS — I252 Old myocardial infarction: Secondary | ICD-10-CM | POA: Diagnosis not present

## 2016-11-08 DIAGNOSIS — Z8679 Personal history of other diseases of the circulatory system: Secondary | ICD-10-CM | POA: Diagnosis not present

## 2016-11-08 DIAGNOSIS — I679 Cerebrovascular disease, unspecified: Secondary | ICD-10-CM | POA: Diagnosis not present

## 2016-11-08 DIAGNOSIS — G309 Alzheimer's disease, unspecified: Secondary | ICD-10-CM | POA: Diagnosis not present

## 2016-11-08 DIAGNOSIS — Z8639 Personal history of other endocrine, nutritional and metabolic disease: Secondary | ICD-10-CM | POA: Diagnosis not present

## 2016-11-11 DIAGNOSIS — G309 Alzheimer's disease, unspecified: Secondary | ICD-10-CM | POA: Diagnosis not present

## 2016-11-11 DIAGNOSIS — Z8639 Personal history of other endocrine, nutritional and metabolic disease: Secondary | ICD-10-CM | POA: Diagnosis not present

## 2016-11-11 DIAGNOSIS — I679 Cerebrovascular disease, unspecified: Secondary | ICD-10-CM | POA: Diagnosis not present

## 2016-11-11 DIAGNOSIS — I252 Old myocardial infarction: Secondary | ICD-10-CM | POA: Diagnosis not present

## 2016-11-11 DIAGNOSIS — Z8679 Personal history of other diseases of the circulatory system: Secondary | ICD-10-CM | POA: Diagnosis not present

## 2016-11-13 DIAGNOSIS — Z8679 Personal history of other diseases of the circulatory system: Secondary | ICD-10-CM | POA: Diagnosis not present

## 2016-11-13 DIAGNOSIS — I252 Old myocardial infarction: Secondary | ICD-10-CM | POA: Diagnosis not present

## 2016-11-13 DIAGNOSIS — I679 Cerebrovascular disease, unspecified: Secondary | ICD-10-CM | POA: Diagnosis not present

## 2016-11-13 DIAGNOSIS — G309 Alzheimer's disease, unspecified: Secondary | ICD-10-CM | POA: Diagnosis not present

## 2016-11-13 DIAGNOSIS — Z8639 Personal history of other endocrine, nutritional and metabolic disease: Secondary | ICD-10-CM | POA: Diagnosis not present

## 2016-11-16 DIAGNOSIS — I252 Old myocardial infarction: Secondary | ICD-10-CM | POA: Diagnosis not present

## 2016-11-16 DIAGNOSIS — Z8639 Personal history of other endocrine, nutritional and metabolic disease: Secondary | ICD-10-CM | POA: Diagnosis not present

## 2016-11-16 DIAGNOSIS — Z8679 Personal history of other diseases of the circulatory system: Secondary | ICD-10-CM | POA: Diagnosis not present

## 2016-11-16 DIAGNOSIS — G309 Alzheimer's disease, unspecified: Secondary | ICD-10-CM | POA: Diagnosis not present

## 2016-11-16 DIAGNOSIS — I679 Cerebrovascular disease, unspecified: Secondary | ICD-10-CM | POA: Diagnosis not present

## 2016-11-17 DIAGNOSIS — G309 Alzheimer's disease, unspecified: Secondary | ICD-10-CM | POA: Diagnosis not present

## 2016-11-18 DIAGNOSIS — I679 Cerebrovascular disease, unspecified: Secondary | ICD-10-CM | POA: Diagnosis not present

## 2016-11-18 DIAGNOSIS — Z8679 Personal history of other diseases of the circulatory system: Secondary | ICD-10-CM | POA: Diagnosis not present

## 2016-11-18 DIAGNOSIS — Z8639 Personal history of other endocrine, nutritional and metabolic disease: Secondary | ICD-10-CM | POA: Diagnosis not present

## 2016-11-18 DIAGNOSIS — G309 Alzheimer's disease, unspecified: Secondary | ICD-10-CM | POA: Diagnosis not present

## 2016-11-18 DIAGNOSIS — I252 Old myocardial infarction: Secondary | ICD-10-CM | POA: Diagnosis not present

## 2016-11-23 DIAGNOSIS — Z8639 Personal history of other endocrine, nutritional and metabolic disease: Secondary | ICD-10-CM | POA: Diagnosis not present

## 2016-11-23 DIAGNOSIS — I252 Old myocardial infarction: Secondary | ICD-10-CM | POA: Diagnosis not present

## 2016-11-23 DIAGNOSIS — G309 Alzheimer's disease, unspecified: Secondary | ICD-10-CM | POA: Diagnosis not present

## 2016-11-23 DIAGNOSIS — Z8679 Personal history of other diseases of the circulatory system: Secondary | ICD-10-CM | POA: Diagnosis not present

## 2016-11-23 DIAGNOSIS — I679 Cerebrovascular disease, unspecified: Secondary | ICD-10-CM | POA: Diagnosis not present

## 2016-11-25 DIAGNOSIS — I679 Cerebrovascular disease, unspecified: Secondary | ICD-10-CM | POA: Diagnosis not present

## 2016-11-25 DIAGNOSIS — G309 Alzheimer's disease, unspecified: Secondary | ICD-10-CM | POA: Diagnosis not present

## 2016-11-25 DIAGNOSIS — Z8639 Personal history of other endocrine, nutritional and metabolic disease: Secondary | ICD-10-CM | POA: Diagnosis not present

## 2016-11-25 DIAGNOSIS — I252 Old myocardial infarction: Secondary | ICD-10-CM | POA: Diagnosis not present

## 2016-11-25 DIAGNOSIS — Z8679 Personal history of other diseases of the circulatory system: Secondary | ICD-10-CM | POA: Diagnosis not present

## 2016-11-29 DIAGNOSIS — Z8639 Personal history of other endocrine, nutritional and metabolic disease: Secondary | ICD-10-CM | POA: Diagnosis not present

## 2016-11-29 DIAGNOSIS — G309 Alzheimer's disease, unspecified: Secondary | ICD-10-CM | POA: Diagnosis not present

## 2016-11-29 DIAGNOSIS — I679 Cerebrovascular disease, unspecified: Secondary | ICD-10-CM | POA: Diagnosis not present

## 2016-11-29 DIAGNOSIS — I252 Old myocardial infarction: Secondary | ICD-10-CM | POA: Diagnosis not present

## 2016-11-29 DIAGNOSIS — Z8679 Personal history of other diseases of the circulatory system: Secondary | ICD-10-CM | POA: Diagnosis not present

## 2016-12-01 DIAGNOSIS — I252 Old myocardial infarction: Secondary | ICD-10-CM | POA: Diagnosis not present

## 2016-12-01 DIAGNOSIS — Z8679 Personal history of other diseases of the circulatory system: Secondary | ICD-10-CM | POA: Diagnosis not present

## 2016-12-01 DIAGNOSIS — Z8639 Personal history of other endocrine, nutritional and metabolic disease: Secondary | ICD-10-CM | POA: Diagnosis not present

## 2016-12-01 DIAGNOSIS — G309 Alzheimer's disease, unspecified: Secondary | ICD-10-CM | POA: Diagnosis not present

## 2016-12-01 DIAGNOSIS — I679 Cerebrovascular disease, unspecified: Secondary | ICD-10-CM | POA: Diagnosis not present

## 2016-12-02 DIAGNOSIS — Z8679 Personal history of other diseases of the circulatory system: Secondary | ICD-10-CM | POA: Diagnosis not present

## 2016-12-02 DIAGNOSIS — I679 Cerebrovascular disease, unspecified: Secondary | ICD-10-CM | POA: Diagnosis not present

## 2016-12-02 DIAGNOSIS — I252 Old myocardial infarction: Secondary | ICD-10-CM | POA: Diagnosis not present

## 2016-12-02 DIAGNOSIS — G309 Alzheimer's disease, unspecified: Secondary | ICD-10-CM | POA: Diagnosis not present

## 2016-12-02 DIAGNOSIS — Z8639 Personal history of other endocrine, nutritional and metabolic disease: Secondary | ICD-10-CM | POA: Diagnosis not present

## 2016-12-03 DIAGNOSIS — I679 Cerebrovascular disease, unspecified: Secondary | ICD-10-CM | POA: Diagnosis not present

## 2016-12-03 DIAGNOSIS — I252 Old myocardial infarction: Secondary | ICD-10-CM | POA: Diagnosis not present

## 2016-12-03 DIAGNOSIS — Z8639 Personal history of other endocrine, nutritional and metabolic disease: Secondary | ICD-10-CM | POA: Diagnosis not present

## 2016-12-03 DIAGNOSIS — G309 Alzheimer's disease, unspecified: Secondary | ICD-10-CM | POA: Diagnosis not present

## 2016-12-03 DIAGNOSIS — Z8679 Personal history of other diseases of the circulatory system: Secondary | ICD-10-CM | POA: Diagnosis not present

## 2016-12-06 DIAGNOSIS — I679 Cerebrovascular disease, unspecified: Secondary | ICD-10-CM | POA: Diagnosis not present

## 2016-12-06 DIAGNOSIS — G309 Alzheimer's disease, unspecified: Secondary | ICD-10-CM | POA: Diagnosis not present

## 2016-12-06 DIAGNOSIS — Z8679 Personal history of other diseases of the circulatory system: Secondary | ICD-10-CM | POA: Diagnosis not present

## 2016-12-06 DIAGNOSIS — Z8639 Personal history of other endocrine, nutritional and metabolic disease: Secondary | ICD-10-CM | POA: Diagnosis not present

## 2016-12-06 DIAGNOSIS — I252 Old myocardial infarction: Secondary | ICD-10-CM | POA: Diagnosis not present

## 2016-12-09 DIAGNOSIS — I252 Old myocardial infarction: Secondary | ICD-10-CM | POA: Diagnosis not present

## 2016-12-09 DIAGNOSIS — Z8639 Personal history of other endocrine, nutritional and metabolic disease: Secondary | ICD-10-CM | POA: Diagnosis not present

## 2016-12-09 DIAGNOSIS — Z8679 Personal history of other diseases of the circulatory system: Secondary | ICD-10-CM | POA: Diagnosis not present

## 2016-12-09 DIAGNOSIS — I679 Cerebrovascular disease, unspecified: Secondary | ICD-10-CM | POA: Diagnosis not present

## 2016-12-09 DIAGNOSIS — G309 Alzheimer's disease, unspecified: Secondary | ICD-10-CM | POA: Diagnosis not present

## 2016-12-10 DIAGNOSIS — G309 Alzheimer's disease, unspecified: Secondary | ICD-10-CM | POA: Diagnosis not present

## 2016-12-10 DIAGNOSIS — I679 Cerebrovascular disease, unspecified: Secondary | ICD-10-CM | POA: Diagnosis not present

## 2016-12-10 DIAGNOSIS — I252 Old myocardial infarction: Secondary | ICD-10-CM | POA: Diagnosis not present

## 2016-12-10 DIAGNOSIS — Z8679 Personal history of other diseases of the circulatory system: Secondary | ICD-10-CM | POA: Diagnosis not present

## 2016-12-10 DIAGNOSIS — Z8639 Personal history of other endocrine, nutritional and metabolic disease: Secondary | ICD-10-CM | POA: Diagnosis not present

## 2016-12-13 DIAGNOSIS — Z8679 Personal history of other diseases of the circulatory system: Secondary | ICD-10-CM | POA: Diagnosis not present

## 2016-12-13 DIAGNOSIS — I252 Old myocardial infarction: Secondary | ICD-10-CM | POA: Diagnosis not present

## 2016-12-13 DIAGNOSIS — G309 Alzheimer's disease, unspecified: Secondary | ICD-10-CM | POA: Diagnosis not present

## 2016-12-13 DIAGNOSIS — I679 Cerebrovascular disease, unspecified: Secondary | ICD-10-CM | POA: Diagnosis not present

## 2016-12-13 DIAGNOSIS — Z8639 Personal history of other endocrine, nutritional and metabolic disease: Secondary | ICD-10-CM | POA: Diagnosis not present

## 2016-12-16 DIAGNOSIS — G309 Alzheimer's disease, unspecified: Secondary | ICD-10-CM | POA: Diagnosis not present

## 2016-12-16 DIAGNOSIS — Z8639 Personal history of other endocrine, nutritional and metabolic disease: Secondary | ICD-10-CM | POA: Diagnosis not present

## 2016-12-16 DIAGNOSIS — I252 Old myocardial infarction: Secondary | ICD-10-CM | POA: Diagnosis not present

## 2016-12-16 DIAGNOSIS — I679 Cerebrovascular disease, unspecified: Secondary | ICD-10-CM | POA: Diagnosis not present

## 2016-12-16 DIAGNOSIS — Z8679 Personal history of other diseases of the circulatory system: Secondary | ICD-10-CM | POA: Diagnosis not present

## 2016-12-17 DIAGNOSIS — I252 Old myocardial infarction: Secondary | ICD-10-CM | POA: Diagnosis not present

## 2016-12-17 DIAGNOSIS — Z8679 Personal history of other diseases of the circulatory system: Secondary | ICD-10-CM | POA: Diagnosis not present

## 2016-12-17 DIAGNOSIS — Z8639 Personal history of other endocrine, nutritional and metabolic disease: Secondary | ICD-10-CM | POA: Diagnosis not present

## 2016-12-17 DIAGNOSIS — G309 Alzheimer's disease, unspecified: Secondary | ICD-10-CM | POA: Diagnosis not present

## 2016-12-17 DIAGNOSIS — I679 Cerebrovascular disease, unspecified: Secondary | ICD-10-CM | POA: Diagnosis not present

## 2016-12-20 DIAGNOSIS — I679 Cerebrovascular disease, unspecified: Secondary | ICD-10-CM | POA: Diagnosis not present

## 2016-12-20 DIAGNOSIS — Z8679 Personal history of other diseases of the circulatory system: Secondary | ICD-10-CM | POA: Diagnosis not present

## 2016-12-20 DIAGNOSIS — Z8639 Personal history of other endocrine, nutritional and metabolic disease: Secondary | ICD-10-CM | POA: Diagnosis not present

## 2016-12-20 DIAGNOSIS — G309 Alzheimer's disease, unspecified: Secondary | ICD-10-CM | POA: Diagnosis not present

## 2016-12-20 DIAGNOSIS — I252 Old myocardial infarction: Secondary | ICD-10-CM | POA: Diagnosis not present

## 2016-12-23 DIAGNOSIS — I252 Old myocardial infarction: Secondary | ICD-10-CM | POA: Diagnosis not present

## 2016-12-23 DIAGNOSIS — Z8639 Personal history of other endocrine, nutritional and metabolic disease: Secondary | ICD-10-CM | POA: Diagnosis not present

## 2016-12-23 DIAGNOSIS — G309 Alzheimer's disease, unspecified: Secondary | ICD-10-CM | POA: Diagnosis not present

## 2016-12-23 DIAGNOSIS — Z8679 Personal history of other diseases of the circulatory system: Secondary | ICD-10-CM | POA: Diagnosis not present

## 2016-12-23 DIAGNOSIS — I679 Cerebrovascular disease, unspecified: Secondary | ICD-10-CM | POA: Diagnosis not present

## 2016-12-24 DIAGNOSIS — Z8639 Personal history of other endocrine, nutritional and metabolic disease: Secondary | ICD-10-CM | POA: Diagnosis not present

## 2016-12-24 DIAGNOSIS — Z8679 Personal history of other diseases of the circulatory system: Secondary | ICD-10-CM | POA: Diagnosis not present

## 2016-12-24 DIAGNOSIS — I679 Cerebrovascular disease, unspecified: Secondary | ICD-10-CM | POA: Diagnosis not present

## 2016-12-24 DIAGNOSIS — G309 Alzheimer's disease, unspecified: Secondary | ICD-10-CM | POA: Diagnosis not present

## 2016-12-24 DIAGNOSIS — I252 Old myocardial infarction: Secondary | ICD-10-CM | POA: Diagnosis not present

## 2016-12-27 DIAGNOSIS — I252 Old myocardial infarction: Secondary | ICD-10-CM | POA: Diagnosis not present

## 2016-12-27 DIAGNOSIS — G309 Alzheimer's disease, unspecified: Secondary | ICD-10-CM | POA: Diagnosis not present

## 2016-12-27 DIAGNOSIS — Z8639 Personal history of other endocrine, nutritional and metabolic disease: Secondary | ICD-10-CM | POA: Diagnosis not present

## 2016-12-27 DIAGNOSIS — Z8679 Personal history of other diseases of the circulatory system: Secondary | ICD-10-CM | POA: Diagnosis not present

## 2016-12-27 DIAGNOSIS — I679 Cerebrovascular disease, unspecified: Secondary | ICD-10-CM | POA: Diagnosis not present

## 2016-12-30 DIAGNOSIS — I679 Cerebrovascular disease, unspecified: Secondary | ICD-10-CM | POA: Diagnosis not present

## 2016-12-30 DIAGNOSIS — Z8679 Personal history of other diseases of the circulatory system: Secondary | ICD-10-CM | POA: Diagnosis not present

## 2016-12-30 DIAGNOSIS — G309 Alzheimer's disease, unspecified: Secondary | ICD-10-CM | POA: Diagnosis not present

## 2016-12-30 DIAGNOSIS — I252 Old myocardial infarction: Secondary | ICD-10-CM | POA: Diagnosis not present

## 2016-12-30 DIAGNOSIS — Z8639 Personal history of other endocrine, nutritional and metabolic disease: Secondary | ICD-10-CM | POA: Diagnosis not present

## 2016-12-31 DIAGNOSIS — I252 Old myocardial infarction: Secondary | ICD-10-CM | POA: Diagnosis not present

## 2016-12-31 DIAGNOSIS — Z8679 Personal history of other diseases of the circulatory system: Secondary | ICD-10-CM | POA: Diagnosis not present

## 2016-12-31 DIAGNOSIS — G309 Alzheimer's disease, unspecified: Secondary | ICD-10-CM | POA: Diagnosis not present

## 2016-12-31 DIAGNOSIS — I679 Cerebrovascular disease, unspecified: Secondary | ICD-10-CM | POA: Diagnosis not present

## 2016-12-31 DIAGNOSIS — Z8639 Personal history of other endocrine, nutritional and metabolic disease: Secondary | ICD-10-CM | POA: Diagnosis not present

## 2017-01-03 DIAGNOSIS — I679 Cerebrovascular disease, unspecified: Secondary | ICD-10-CM | POA: Diagnosis not present

## 2017-01-03 DIAGNOSIS — I252 Old myocardial infarction: Secondary | ICD-10-CM | POA: Diagnosis not present

## 2017-01-03 DIAGNOSIS — Z8679 Personal history of other diseases of the circulatory system: Secondary | ICD-10-CM | POA: Diagnosis not present

## 2017-01-03 DIAGNOSIS — G309 Alzheimer's disease, unspecified: Secondary | ICD-10-CM | POA: Diagnosis not present

## 2017-01-03 DIAGNOSIS — Z8639 Personal history of other endocrine, nutritional and metabolic disease: Secondary | ICD-10-CM | POA: Diagnosis not present

## 2017-01-05 DIAGNOSIS — I252 Old myocardial infarction: Secondary | ICD-10-CM | POA: Diagnosis not present

## 2017-01-05 DIAGNOSIS — I679 Cerebrovascular disease, unspecified: Secondary | ICD-10-CM | POA: Diagnosis not present

## 2017-01-05 DIAGNOSIS — G309 Alzheimer's disease, unspecified: Secondary | ICD-10-CM | POA: Diagnosis not present

## 2017-01-05 DIAGNOSIS — Z8639 Personal history of other endocrine, nutritional and metabolic disease: Secondary | ICD-10-CM | POA: Diagnosis not present

## 2017-01-05 DIAGNOSIS — Z8679 Personal history of other diseases of the circulatory system: Secondary | ICD-10-CM | POA: Diagnosis not present

## 2017-01-06 DIAGNOSIS — I679 Cerebrovascular disease, unspecified: Secondary | ICD-10-CM | POA: Diagnosis not present

## 2017-01-06 DIAGNOSIS — I252 Old myocardial infarction: Secondary | ICD-10-CM | POA: Diagnosis not present

## 2017-01-06 DIAGNOSIS — Z8679 Personal history of other diseases of the circulatory system: Secondary | ICD-10-CM | POA: Diagnosis not present

## 2017-01-06 DIAGNOSIS — Z8639 Personal history of other endocrine, nutritional and metabolic disease: Secondary | ICD-10-CM | POA: Diagnosis not present

## 2017-01-06 DIAGNOSIS — G309 Alzheimer's disease, unspecified: Secondary | ICD-10-CM | POA: Diagnosis not present

## 2017-01-07 DIAGNOSIS — I252 Old myocardial infarction: Secondary | ICD-10-CM | POA: Diagnosis not present

## 2017-01-07 DIAGNOSIS — I679 Cerebrovascular disease, unspecified: Secondary | ICD-10-CM | POA: Diagnosis not present

## 2017-01-07 DIAGNOSIS — G309 Alzheimer's disease, unspecified: Secondary | ICD-10-CM | POA: Diagnosis not present

## 2017-01-07 DIAGNOSIS — Z8679 Personal history of other diseases of the circulatory system: Secondary | ICD-10-CM | POA: Diagnosis not present

## 2017-01-07 DIAGNOSIS — Z8639 Personal history of other endocrine, nutritional and metabolic disease: Secondary | ICD-10-CM | POA: Diagnosis not present

## 2017-01-10 DIAGNOSIS — Z8639 Personal history of other endocrine, nutritional and metabolic disease: Secondary | ICD-10-CM | POA: Diagnosis not present

## 2017-01-10 DIAGNOSIS — I252 Old myocardial infarction: Secondary | ICD-10-CM | POA: Diagnosis not present

## 2017-01-10 DIAGNOSIS — Z8679 Personal history of other diseases of the circulatory system: Secondary | ICD-10-CM | POA: Diagnosis not present

## 2017-01-10 DIAGNOSIS — I679 Cerebrovascular disease, unspecified: Secondary | ICD-10-CM | POA: Diagnosis not present

## 2017-01-10 DIAGNOSIS — G309 Alzheimer's disease, unspecified: Secondary | ICD-10-CM | POA: Diagnosis not present

## 2017-01-18 DIAGNOSIS — G301 Alzheimer's disease with late onset: Secondary | ICD-10-CM | POA: Diagnosis not present

## 2017-01-18 DIAGNOSIS — G309 Alzheimer's disease, unspecified: Secondary | ICD-10-CM | POA: Diagnosis not present

## 2017-03-15 DEATH — deceased
